# Patient Record
Sex: Female | Born: 1960 | Race: Black or African American | Hispanic: No | Marital: Single | Smoking: Never smoker
Health system: Southern US, Community
[De-identification: ages and names within clinical notes are randomized; demographics above are authoritative.]

## PROBLEM LIST (undated history)

## (undated) DIAGNOSIS — M4800 Spinal stenosis, site unspecified: Secondary | ICD-10-CM

## (undated) DIAGNOSIS — I10 Essential (primary) hypertension: Secondary | ICD-10-CM

## (undated) DIAGNOSIS — K219 Gastro-esophageal reflux disease without esophagitis: Secondary | ICD-10-CM

## (undated) DIAGNOSIS — M1990 Unspecified osteoarthritis, unspecified site: Secondary | ICD-10-CM

## (undated) HISTORY — PX: HERNIA REPAIR: SHX51

## (undated) HISTORY — PX: PITUITARY EXCISION: SHX745

## (undated) HISTORY — PX: TUBAL LIGATION: SHX77

## (undated) HISTORY — PX: CHOLECYSTECTOMY: SHX55

---

## 2016-12-11 HISTORY — PX: BREAST CYST EXCISION: SHX579

## 2018-09-16 ENCOUNTER — Other Ambulatory Visit: Payer: Self-pay

## 2018-09-16 ENCOUNTER — Ambulatory Visit
Admission: EM | Admit: 2018-09-16 | Discharge: 2018-09-16 | Disposition: A | Discharge status: Home / Self Care | Payer: Federal, State, Local not specified - PPO | Attending: Family Medicine | Admitting: Family Medicine

## 2018-09-16 DIAGNOSIS — R1013 Epigastric pain: Secondary | ICD-10-CM

## 2018-09-16 DIAGNOSIS — I878 Other specified disorders of veins: Secondary | ICD-10-CM

## 2018-09-16 HISTORY — DX: Spinal stenosis, site unspecified: M48.00

## 2018-09-16 LAB — COMPREHENSIVE METABOLIC PANEL
ALBUMIN: 4.5 g/dL (ref 3.5–5.0)
ALT: 33 U/L (ref 0–44)
ANION GAP: 11 (ref 5–15)
AST: 23 U/L (ref 15–41)
Alkaline Phosphatase: 65 U/L (ref 38–126)
BUN: 21 mg/dL — ABNORMAL HIGH (ref 6–20)
CO2: 27 mmol/L (ref 22–32)
CREATININE: 0.89 mg/dL (ref 0.44–1.00)
Calcium: 9.3 mg/dL (ref 8.9–10.3)
Chloride: 103 mmol/L (ref 98–111)
GFR calc non Af Amer: >60 mL/min (ref >60)
GLUCOSE: 109 mg/dL — AB (ref 70–99)
Potassium: 3.8 mmol/L (ref 3.5–5.1)
SODIUM: 141 mmol/L (ref 135–145)
TOTAL PROTEIN: 7.8 g/dL (ref 6.5–8.1)
Total Bilirubin: 0.6 mg/dL (ref 0.3–1.2)

## 2018-09-16 LAB — LIPASE, BLOOD: Lipase: 20 U/L (ref 11–51)

## 2018-09-16 NOTE — ED Triage Notes (Signed)
Patient complains of bilateral leg swelling on and off for one month.

## 2018-09-16 NOTE — ED Provider Notes (Signed)
[facility]    CSN: [id number] Arrival date & time: 09/16/18  1438  History   Chief Complaint Chief Complaint  Patient presents with  . Leg Swelling   HPI  57 year old female presents with bilateral lower extremity swelling as well as epigastric pain.  1 month history of swelling in the feet/ankles.  She has been taking NSAIDs more frequently recently.  She is currently on diuretic therapy for hypertension.  She is not particularly active secondary to ongoing chronic back pain.  Improve after elevating/sleeping.  They are normal in the morning and then worsened throughout the day. No other associated symptoms.   Additionally, patient reports a one-week history of abdominal pain.  Located in the epigastric region.  Mild.  She has been using NSAIDs recently.  Has a history of pancreatitis and wants to be tested for this today.  No recent alcohol use.  No other complaints.  PMH, Surgical Hx, Family Hx, Social History reviewed and updated as below.  PMH:  Hx of pancreatitis Past Medical History:  Diagnosis Date  . Spinal stenosis    Past Surgical History:  Procedure Laterality Date  . CHOLECYSTECTOMY    . HERNIA REPAIR    . PITUITARY EXCISION    . TUBAL LIGATION      OB History   None    Home Medications    Prior to Admission medications   Medication Sig Start Date End Date Taking? Authorizing Provider  candesartan (ATACAND) 16 MG tablet Take 16 mg by mouth daily.   Yes [provider]  HYDROcodone-acetaminophen (NORCO/VICODIN) 5-325 MG tablet Take 1 tablet by mouth every 6 (six) hours as needed for moderate pain.   Yes [provider]  metaxalone (SKELAXIN) 800 MG tablet Take 800 mg by mouth 3 (three) times daily.   Yes [provider]  metoprolol succinate (TOPROL-XL) 100 MG 24 hr tablet Take 100 mg by mouth daily. Take with or immediately following a meal.   Yes [provider]  naproxen (NAPROSYN) 500 MG tablet Take 500  mg by mouth 2 (two) times daily with a meal.   Yes [provider]  omeprazole (PRILOSEC) 40 MG capsule Take 40 mg by mouth daily.   Yes [provider]  triamterene-hydrochlorothiazide (MAXZIDE-25) 37.5-25 MG tablet Take 1 tablet by mouth daily.   Yes [provider]   Family History Family History  Problem Relation Age of Onset  . Diabetes Mother   . Hypertension Mother    Social History Social History   Tobacco Use  . Smoking status: Never Smoker  . Smokeless tobacco: Never Used  Substance Use Topics  . Alcohol use: Yes    Comment: occasionally  . Drug use: Not Currently   Allergies   Amlodipine and Trilyte [peg 3350-kcl-na bicarb-nacl]   Review of Systems Review of Systems  Respiratory: Negative.   Cardiovascular: Positive for leg swelling.  Gastrointestinal: Positive for abdominal pain.   Physical Exam Triage Vital Signs ED Triage Vitals  Enc Vitals Group     BP 09/16/18 1549 (!) 167/94     Pulse Rate 09/16/18 1549 70     Resp 09/16/18 1549 18     Temp 09/16/18 1549 98 F (36.7 C)     Temp Source 09/16/18 1549 Oral     SpO2 09/16/18 1549 100 %     Weight 09/16/18 1543 235 lb (106.6 kg)     Height 09/16/18 1543 5\' 4"  (1.626 m)     Head Circumference --  Peak Flow --      Pain Score 09/16/18 1542 7     Pain Loc --      Pain Edu? --      Excl. in GC? --    Updated Vital Signs BP (!) 167/94 (BP Location: Left Arm)   Pulse 70   Temp 98 F (36.7 C) (Oral)   Resp 18   Ht 5\' 4"  (1.626 m)   Wt 106.6 kg   SpO2 100%   BMI 40.34 kg/m   Visual Acuity Right Eye Distance:   Left Eye Distance:   Bilateral Distance:    Right Eye Near:   Left Eye Near:    Bilateral Near:     Physical Exam  Constitutional: She is oriented to person, place, and time. She appears well-developed. No distress.  HENT:  Head: Normocephalic and atraumatic.  Cardiovascular: Normal rate and regular rhythm.  Pulmonary/Chest: Effort normal and breath  sounds normal. She has no wheezes. She has no rales.  Abdominal: Soft. She exhibits no distension. There is no tenderness.  Neurological: She is alert and oriented to person, place, and time.  Psychiatric: She has a normal mood and affect. Her behavior is normal.  Nursing note and vitals reviewed.  UC Treatments / Results  Labs (all labs ordered are listed, but only abnormal results are displayed) Labs Reviewed  COMPREHENSIVE METABOLIC PANEL - Abnormal; Notable for the following components:      Result Value   Glucose, Bld 109 (*)    BUN 21 (*)    All other components within normal limits  LIPASE, BLOOD    EKG None  Radiology No results found.  Procedures Procedures (including critical care time)  Medications Ordered in UC Medications - No data to display  Initial Impression / Assessment and Plan / UC Course  I have reviewed the triage vital signs and the nursing notes.  Pertinent labs & imaging results that were available during my care of the patient were reviewed by me and considered in my medical decision making (see chart for details).    57 year old female presents with lower extremity edema.  This appears to be secondary to venous stasis and obesity.  Advised activity and compression stockings.  Regarding her abdominal pain, her exam is benign.  Laboratory studies done and were unrevealing.  Avoid NSAIDs.  Supportive care.  Final Clinical Impressions(s) / UC Diagnoses   Final diagnoses:  Venous stasis  Abdominal pain, epigastric     Discharge Instructions     Compression stockings. OTC Zantac. Try and avoid NSAID's.  Take care  Dr. Adriana Simas     ED Prescriptions    None     Controlled Substance Prescriptions [place] Controlled Substance Registry consulted? Not Applicable   Tommie Sams, DO 09/16/18 1701

## 2018-09-16 NOTE — Discharge Instructions (Signed)
Compression stockings. OTC Zantac. Try and avoid NSAID's.  Take care  Dr. Adriana Simas

## 2018-10-25 ENCOUNTER — Encounter: Payer: Self-pay | Admitting: Emergency Medicine

## 2018-10-25 ENCOUNTER — Other Ambulatory Visit: Payer: Self-pay

## 2018-10-25 ENCOUNTER — Ambulatory Visit
Admission: EM | Admit: 2018-10-25 | Discharge: 2018-10-25 | Disposition: A | Discharge status: Home / Self Care | Payer: Federal, State, Local not specified - PPO | Attending: Family Medicine | Admitting: Family Medicine

## 2018-10-25 ENCOUNTER — Ambulatory Visit (INDEPENDENT_AMBULATORY_CARE_PROVIDER_SITE_OTHER): Payer: Federal, State, Local not specified - PPO

## 2018-10-25 DIAGNOSIS — M19071 Primary osteoarthritis, right ankle and foot: Secondary | ICD-10-CM | POA: Diagnosis not present

## 2018-10-25 DIAGNOSIS — S99921A Unspecified injury of right foot, initial encounter: Secondary | ICD-10-CM | POA: Diagnosis not present

## 2018-10-25 DIAGNOSIS — M79671 Pain in right foot: Secondary | ICD-10-CM | POA: Diagnosis not present

## 2018-10-25 DIAGNOSIS — W208XXA Other cause of strike by thrown, projected or falling object, initial encounter: Secondary | ICD-10-CM

## 2018-10-25 MED ORDER — MELOXICAM 15 MG PO TABS: 15.0000 mg | ORAL_TABLET | Freq: Every day | ORAL | 0 refills | Status: DC | PRN

## 2018-10-25 NOTE — ED Provider Notes (Signed)
[facility]    CSN: [id number] Arrival date & time: 10/25/18  1240  History   Chief Complaint Chief Complaint  Patient presents with  . Foot Pain    right   HPI   57 year old female presents with a right foot pain.  Started on Saturday after she dropped an object on her foot.  She states that she was packing some boxes.  She has a known bunion to this foot.  Her pain is located at the bunion.  Mild swelling.  Mild warmth.  Worse with activity.  No relieving factors.  No other reported symptoms.  No other complaints.   PMH, Surgical Hx, Family Hx, Social History reviewed and updated as below.  Past Medical History:  Diagnosis Date  . Spinal stenosis    Past Surgical History:  Procedure Laterality Date  . CHOLECYSTECTOMY    . HERNIA REPAIR    . PITUITARY EXCISION    . TUBAL LIGATION      OB History   None    Home Medications    Prior to Admission medications   Medication Sig Start Date End Date Taking? Authorizing Provider  candesartan (ATACAND) 16 MG tablet Take 16 mg by mouth daily.   Yes [provider]  HYDROcodone-acetaminophen (NORCO/VICODIN) 5-325 MG tablet Take 1 tablet by mouth every 6 (six) hours as needed for moderate pain.   Yes [provider]  metaxalone (SKELAXIN) 800 MG tablet Take 800 mg by mouth 3 (three) times daily.   Yes [provider]  metoprolol succinate (TOPROL-XL) 100 MG 24 hr tablet Take 100 mg by mouth daily. Take with or immediately following a meal.   Yes [provider]  omeprazole (PRILOSEC) 40 MG capsule Take 40 mg by mouth daily.   Yes [provider]  triamterene-hydrochlorothiazide (MAXZIDE-25) 37.5-25 MG tablet Take 1 tablet by mouth daily.   Yes [provider]  meloxicam (MOBIC) 15 MG tablet Take 1 tablet (15 mg total) by mouth daily as needed. 10/25/18   Tommie Samsook, Jakiah Goree G, DO   Family History Family History  Problem Relation Age of Onset  . Diabetes Mother   .  Hypertension Mother     Social History Social History   Tobacco Use  . Smoking status: Never Smoker  . Smokeless tobacco: Never Used  Substance Use Topics  . Alcohol use: Yes    Comment: occasionally  . Drug use: Not Currently     Allergies   Amlodipine and Trilyte [peg 3350-kcl-na bicarb-nacl]   Review of Systems Review of Systems  Constitutional: Negative.   Musculoskeletal:       Right foot pain.   Physical Exam Triage Vital Signs ED Triage Vitals  Enc Vitals Group     BP 10/25/18 1255 (!) 179/103     Pulse Rate 10/25/18 1255 83     Resp 10/25/18 1255 16     Temp 10/25/18 1255 98.2 F (36.8 C)     Temp Source 10/25/18 1255 Oral     SpO2 10/25/18 1255 99 %     Weight 10/25/18 1253 240 lb (108.9 kg)     Height 10/25/18 1253 5\' 4"  (1.626 m)     Head Circumference --      Peak Flow --      Pain Score 10/25/18 1253 7     Pain Loc --      Pain Edu? --      Excl. in GC? --    Updated Vital Signs BP Marland Kitchen(!)  179/103 (BP Location: Right Arm)   Pulse 83   Temp 98.2 F (36.8 C) (Oral)   Resp 16   Ht 5\' 4"  (1.626 m)   Wt 108.9 kg   SpO2 99%   BMI 41.20 kg/m   Visual Acuity Right Eye Distance:   Left Eye Distance:   Bilateral Distance:    Right Eye Near:   Left Eye Near:    Bilateral Near:     Physical Exam  Constitutional: She is oriented to person, place, and time. She appears well-developed. No distress.  HENT:  Head: Normocephalic and atraumatic.  Cardiovascular: Normal rate and regular rhythm.  Pulmonary/Chest: Effort normal. No respiratory distress.  Musculoskeletal:  Right foot -hallux valgus noted.  Mild tenderness to palpation.  Neurological: She is alert and oriented to person, place, and time.  Psychiatric: She has a normal mood and affect. Her behavior is normal.  Nursing note and vitals reviewed.  UC Treatments / Results  Labs (all labs ordered are listed, but only abnormal results are displayed) Labs Reviewed - No data to  display  EKG None  Radiology Dg Foot Complete Right  Result Date: 10/25/2018 CLINICAL DATA:  Trauma, injury, swelling EXAM: RIGHT FOOT COMPLETE - 3+ VIEW COMPARISON:  None. FINDINGS: Hallux valgus deformity noted of the right first MTP joint with associated osteoarthritis. No acute osseous finding or fracture. No other significant joint abnormality. Small plantar calcaneal spur. No radiopaque foreign body. 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 No other acute finding by plain radiography Electronically Signed   By: Judie Petit.  Shick M.D.   On: 10/25/2018 13:30    Procedures Procedures (including critical care time)  Medications Ordered in UC Medications - No data to display  Initial Impression / Assessment and Plan / UC Course  I have reviewed the triage vital signs and the nursing notes.  Pertinent labs & imaging results that were available during my care of the patient were reviewed by me and considered in my medical decision making (see chart for details).    57 year old female presents with right foot pain.  Patient has hallux valgus and associated osteoarthritis.  Letter from the injury.  Treating with meloxicam, rest, ice, elevation.  Supportive care.  Final Clinical Impressions(s) / UC Diagnoses   Final diagnoses:  Right foot pain     Discharge Instructions     Rest, ice, elevate.  Mobic as prescribed.  Take care  Dr. Adriana Simas    ED Prescriptions    Medication Sig Dispense Auth. Provider   meloxicam (MOBIC) 15 MG tablet Take 1 tablet (15 mg total) by mouth daily as needed. 30 tablet Tommie Sams, DO     Controlled Substance Prescriptions [place] Controlled Substance Registry consulted? Not Applicable   Tommie Sams, DO 10/25/18 1354

## 2018-10-25 NOTE — Discharge Instructions (Signed)
Rest, ice, elevate.  Mobic as prescribed.  Take care  Dr. Adriana Simasook

## 2018-10-25 NOTE — ED Triage Notes (Signed)
Patient c/o right foot pain and swelling that started on Saturday.  Patient states that something fell on her right foot on Saturday.

## 2018-12-05 ENCOUNTER — Other Ambulatory Visit: Payer: Self-pay | Admitting: Family Medicine

## 2019-01-07 ENCOUNTER — Other Ambulatory Visit: Payer: Self-pay | Admitting: Podiatry

## 2019-01-08 ENCOUNTER — Other Ambulatory Visit: Payer: Self-pay

## 2019-01-08 ENCOUNTER — Encounter: Payer: Self-pay | Admitting: *Deleted

## 2019-01-08 ENCOUNTER — Encounter: Payer: Self-pay | Admitting: Anesthesiology

## 2019-01-09 NOTE — Discharge Instructions (Signed)
[facility] °[facility] ° °POST OPERATIVE INSTRUCTIONS FOR DR. TROXLER AND DR. FOWLER °[facility] ° ° °1. Take your medication as prescribed.  Pain medication should be taken only as needed. ° °2. Keep the dressing clean, dry and intact. ° °3. Keep your foot elevated above the heart level for the first 48 hours. ° °4. Walking to the bathroom and brief periods of walking are acceptable, unless we have instructed you to be non-weight bearing. ° °5. Always wear your post-op shoe when walking.  Always use your crutches if you are to be non-weight bearing. ° °6. Do not take a shower. Baths are permissible as long as the foot is kept out of the water.  ° °7. Every hour you are awake:  °- Bend your knee 15 times. °- Flex foot 15 times °- Massage calf 15 times ° °8. Call [facility] ([phone]) if any of the following problems occur: °- You develop a temperature or fever. °- The bandage becomes saturated with blood. °- Medication does not stop your pain. °- Injury of the foot occurs. °- Any symptoms of infection including redness, odor, or red streaks running from wound. ° ° °General Anesthesia, Adult, Care After °This sheet gives you information about how to care for yourself after your procedure. Your health care provider may also give you more specific instructions. If you have problems or questions, contact your health care provider. °What can I expect after the procedure? °After the procedure, the following side effects are common: °· Pain or discomfort at the IV site. °· Nausea. °· Vomiting. °· Sore throat. °· Trouble concentrating. °· Feeling cold or chills. °· Weak or tired. °· Sleepiness and fatigue. °· Soreness and body aches. These side effects can affect parts of the body that were not involved in surgery. °Follow these instructions at home: ° °For at least 24 hours after the procedure: °· Have a responsible adult stay with you. It is important to  have someone help care for you until you are awake and alert. °· Rest as needed. °· Do not: °? Participate in activities in which you could fall or become injured. °? Drive. °? Use heavy machinery. °? Drink alcohol. °? Take sleeping pills or medicines that cause drowsiness. °? Make important decisions or sign legal documents. °? Take care of children on your own. °Eating and drinking °· Follow any instructions from your health care provider about eating or drinking restrictions. °· When you feel hungry, start by eating small amounts of foods that are soft and easy to digest (bland), such as toast. Gradually return to your regular diet. °· Drink enough fluid to keep your urine pale yellow. °· If you vomit, rehydrate by drinking water, juice, or clear broth. °General instructions °· If you have sleep apnea, surgery and certain medicines can increase your risk for breathing problems. Follow instructions from your health care provider about wearing your sleep device: °? Anytime you are sleeping, including during daytime naps. °? While taking prescription pain medicines, sleeping medicines, or medicines that make you drowsy. °· Return to your normal activities as told by your health care provider. Ask your health care provider what activities are safe for you. °· Take over-the-counter and prescription medicines only as told by your health care provider. °· If you smoke, do not smoke without supervision. °· Keep all follow-up visits as told by your health care provider. This is important. °Contact a health care provider if: °· You have   nausea or vomiting that does not get better with medicine. °· You cannot eat or drink without vomiting. °· You have pain that does not get better with medicine. °· You are unable to pass urine. °· You develop a skin rash. °· You have a fever. °· You have redness around your IV site that gets worse. °Get help right away if: °· You have difficulty breathing. °· You have chest pain. °· You  have blood in your urine or stool, or you vomit blood. °Summary °· After the procedure, it is common to have a sore throat or nausea. It is also common to feel tired. °· Have a responsible adult stay with you for the first 24 hours after general anesthesia. It is important to have someone help care for you until you are awake and alert. °· When you feel hungry, start by eating small amounts of foods that are soft and easy to digest (bland), such as toast. Gradually return to your regular diet. °· Drink enough fluid to keep your urine pale yellow. °· Return to your normal activities as told by your health care provider. Ask your health care provider what activities are safe for you. °This information is not intended to replace advice given to you by your health care provider. Make sure you discuss any questions you have with your health care provider. °Document Released: 03/05/2001 Document Revised: 07/13/2017 Document Reviewed: 07/13/2017 °Elsevier Interactive Patient Education © 2019 [employer] ° °

## 2019-01-16 ENCOUNTER — Encounter
Admission: RE | Disposition: A | Discharge status: Home / Self Care | Payer: Self-pay | Source: Home / Self Care | Attending: Podiatry

## 2019-01-16 ENCOUNTER — Ambulatory Visit
Admission: RE | Admit: 2019-01-16 | Discharge: 2019-01-16 | Disposition: A | Discharge status: Home / Self Care | Payer: Federal, State, Local not specified - PPO | Attending: Podiatry | Admitting: Podiatry

## 2019-01-16 DIAGNOSIS — Z539 Procedure and treatment not carried out, unspecified reason: Secondary | ICD-10-CM | POA: Diagnosis not present

## 2019-01-16 HISTORY — DX: Unspecified osteoarthritis, unspecified site: M19.90

## 2019-01-16 HISTORY — DX: Gastro-esophageal reflux disease without esophagitis: K21.9

## 2019-01-16 HISTORY — DX: Essential (primary) hypertension: I10

## 2019-01-16 LAB — POCT PREGNANCY, URINE: Preg Test, Ur: NEGATIVE

## 2019-01-16 SURGERY — BUNIONECTOMY, LAPIDUS
Anesthesia: Choice | Laterality: Right

## 2019-01-16 MED ORDER — LACTATED RINGERS IV SOLN: INTRAVENOUS | Status: DC

## 2019-01-16 SURGICAL SUPPLY — 46 items
BANDAGE ELASTIC 4 VELCRO NS (GAUZE/BANDAGES/DRESSINGS) ×2 IMPLANT
BENZOIN TINCTURE PRP APPL 2/3 (GAUZE/BANDAGES/DRESSINGS) ×2 IMPLANT
BLADE OSC/SAGITTAL MD 5.5X18 (BLADE) IMPLANT
BLADE OSC/SAGITTAL MD 9X18.5 (BLADE) IMPLANT
BNDG ESMARK 4X12 TAN STRL LF (GAUZE/BANDAGES/DRESSINGS) ×2 IMPLANT
BNDG GAUZE 4.5X4.1 6PLY STRL (MISCELLANEOUS) ×2 IMPLANT
BNDG STRETCH 4X75 STRL LF (GAUZE/BANDAGES/DRESSINGS) ×2 IMPLANT
CANISTER SUCT 1200ML W/VALVE (MISCELLANEOUS) ×2 IMPLANT
CAST PADDING 3X4FT ST 30246 (SOFTGOODS) ×2
COVER LIGHT HANDLE UNIVERSAL (MISCELLANEOUS) ×4 IMPLANT
CUFF TOURN SGL QUICK 18 (TOURNIQUET CUFF) ×2 IMPLANT
DRAPE FLUOR MINI C-ARM 54X84 (DRAPES) ×2 IMPLANT
DURAPREP 26ML APPLICATOR (WOUND CARE) ×2 IMPLANT
ELECT REM PT RETURN 9FT ADLT (ELECTROSURGICAL) ×2
ELECTRODE REM PT RTRN 9FT ADLT (ELECTROSURGICAL) ×1 IMPLANT
GAUZE PETRO XEROFOAM 1X8 (MISCELLANEOUS) ×2 IMPLANT
GAUZE SPONGE 4X4 12PLY STRL (GAUZE/BANDAGES/DRESSINGS) ×2 IMPLANT
GLOVE BIO SURGEON STRL SZ8 (GLOVE) ×2 IMPLANT
GOWN STRL REUS W/ TWL LRG LVL3 (GOWN DISPOSABLE) ×1 IMPLANT
GOWN STRL REUS W/ TWL XL LVL3 (GOWN DISPOSABLE) ×1 IMPLANT
GOWN STRL REUS W/TWL LRG LVL3 (GOWN DISPOSABLE) ×1
GOWN STRL REUS W/TWL XL LVL3 (GOWN DISPOSABLE) ×1
K-WIRE DBL END TROCAR 6X.045 (WIRE)
K-WIRE DBL END TROCAR 6X.062 (WIRE)
KIT TURNOVER KIT A (KITS) ×2 IMPLANT
KWIRE DBL END TROCAR 6X.045 (WIRE) IMPLANT
KWIRE DBL END TROCAR 6X.062 (WIRE) IMPLANT
NEEDLE 18GX1X1/2 (RX/OR ONLY) (NEEDLE) ×2 IMPLANT
NEEDLE HYPO 25GX1X1/2 BEV (NEEDLE) ×2 IMPLANT
NS IRRIG 500ML POUR BTL (IV SOLUTION) ×2 IMPLANT
PACK EXTREMITY ARMC (MISCELLANEOUS) ×2 IMPLANT
PAD CAST CTTN 3X4 STRL (SOFTGOODS) ×2 IMPLANT
PENCIL SMOKE EVACUATOR (MISCELLANEOUS) ×2 IMPLANT
RASP SM TEAR CROSS CUT (RASP) IMPLANT
SPLINT CAST 1 STEP 4X30 (MISCELLANEOUS) ×2 IMPLANT
SPLINT FAST PLASTER 5X30 (CAST SUPPLIES) ×1
SPLINT PLASTER CAST FAST 5X30 (CAST SUPPLIES) ×1 IMPLANT
STOCKINETTE STRL 6IN 960660 (GAUZE/BANDAGES/DRESSINGS) ×2 IMPLANT
STRIP CLOSURE SKIN 1/4X4 (GAUZE/BANDAGES/DRESSINGS) ×2 IMPLANT
SUT VIC AB 2-0 SH 27 (SUTURE)
SUT VIC AB 2-0 SH 27XBRD (SUTURE) IMPLANT
SUT VIC AB 3-0 SH 27 (SUTURE)
SUT VIC AB 3-0 SH 27X BRD (SUTURE) IMPLANT
SUT VIC AB 4-0 FS2 27 (SUTURE) ×2 IMPLANT
SUT VICRYL AB 3-0 FS1 BRD 27IN (SUTURE) IMPLANT
SYR 10ML LL (SYRINGE) ×2 IMPLANT

## 2019-01-16 NOTE — Anesthesia Preprocedure Evaluation (Deleted)

## 2019-01-17 ENCOUNTER — Ambulatory Visit
Admission: EM | Admit: 2019-01-17 | Discharge: 2019-01-17 | Disposition: A | Discharge status: Home / Self Care | Payer: Federal, State, Local not specified - PPO | Attending: Family Medicine | Admitting: Family Medicine

## 2019-01-17 ENCOUNTER — Other Ambulatory Visit: Payer: Self-pay

## 2019-01-17 DIAGNOSIS — I10 Essential (primary) hypertension: Secondary | ICD-10-CM | POA: Diagnosis not present

## 2019-01-17 NOTE — ED Provider Notes (Signed)
[facility]    CSN: [id number] Arrival date & time: 01/17/19  1026     History   Chief Complaint Chief Complaint  Patient presents with  . Hypertension    HPI Dana Hale is a 58 y.o. female.   58 yo female with a c/o high blood pressure. States she takes blood pressure medicine prescribed by her prior PCP but she has an appointment with a new pcp next Tuesday. Denies any chest pains, shortness of breath, numbness/tingling, vision changes.  The history is provided by the patient.    Past Medical History:  Diagnosis Date  . Arthritis    lower back, knees  . GERD (gastroesophageal reflux disease)   . Hypertension   . Spinal stenosis     There are no active problems to display for this patient.   Past Surgical History:  Procedure Laterality Date  . CHOLECYSTECTOMY    . HERNIA REPAIR    . PITUITARY EXCISION    . TUBAL LIGATION      OB History   No obstetric history on file.      Home Medications    Prior to Admission medications   Medication Sig Start Date End Date Taking? Authorizing Provider  BIOTIN PO Take by mouth daily.   Yes [provider]  candesartan (ATACAND) 16 MG tablet Take 16 mg by mouth daily.   Yes [provider]  Cyanocobalamin (VITAMIN B-12 PO) Take by mouth daily.   Yes [provider]  HYDROcodone-acetaminophen (NORCO/VICODIN) 5-325 MG tablet Take 1 tablet by mouth every 6 (six) hours as needed for moderate pain.   Yes [provider]  metaxalone (SKELAXIN) 800 MG tablet Take 800 mg by mouth 3 (three) times daily.   Yes [provider]  metoprolol succinate (TOPROL-XL) 100 MG 24 hr tablet Take 100 mg by mouth daily. Take with or immediately following a meal.   Yes [provider]  Multiple Vitamins-Minerals (MULTIVITAMIN WOMEN PO) Take by mouth daily.   Yes [provider]  naproxen (NAPROSYN) 500 MG tablet Take 500 mg by mouth 2 (two) times daily with a meal.    Yes [provider]  omeprazole (PRILOSEC) 40 MG capsule Take 40 mg by mouth daily.   Yes [provider]  triamterene-hydrochlorothiazide (MAXZIDE-25) 37.5-25 MG tablet Take 1 tablet by mouth daily.   Yes [provider]  meloxicam (MOBIC) 15 MG tablet Take 1 tablet (15 mg total) by mouth daily as needed. 10/25/18   Tommie Sams, DO    Family History Family History  Problem Relation Age of Onset  . Diabetes Mother   . Hypertension Mother     Social History Social History   Tobacco Use  . Smoking status: Never Smoker  . Smokeless tobacco: Never Used  Substance Use Topics  . Alcohol use: Yes    Alcohol/week: 1.0 standard drinks    Types: 1 Standard drinks or equivalent per week    Comment: occasionally  . Drug use: Not Currently     Allergies   Amlodipine and Trilyte [peg 3350-kcl-na bicarb-nacl]   Review of Systems Review of Systems   Physical Exam Triage Vital Signs ED Triage Vitals  Enc Vitals Group     BP 01/17/19 1043 (!) 160/112     Pulse Rate 01/17/19 1043 81     Resp 01/17/19 1043 16     Temp 01/17/19 1043 98.2 F (36.8 C)     Temp Source 01/17/19 1043 Oral  SpO2 01/17/19 1043 97 %     Weight 01/17/19 1041 247 lb (112 kg)     Height 01/17/19 1041 5\' 4"  (1.626 m)     Head Circumference --      Peak Flow --      Pain Score 01/17/19 1041 0     Pain Loc --      Pain Edu? --      Excl. in GC? --    No data found.  Updated Vital Signs BP (!) 160/112 (BP Location: Right Arm)   Pulse 81   Temp 98.2 F (36.8 C) (Oral)   Resp 16   Ht 5\' 4"  (1.626 m)   Wt 112 kg   SpO2 97%   BMI 42.40 kg/m   Visual Acuity Right Eye Distance:   Left Eye Distance:   Bilateral Distance:    Right Eye Near:   Left Eye Near:    Bilateral Near:     Physical Exam Vitals signs and nursing note reviewed.  Constitutional:      General: She is not in acute distress.    Appearance: She is not toxic-appearing or diaphoretic.    Cardiovascular:     Rate and Rhythm: Normal rate and regular rhythm.     Heart sounds: Normal heart sounds.  Pulmonary:     Effort: Pulmonary effort is normal. No respiratory distress.     Breath sounds: Normal breath sounds. No stridor. No wheezing, rhonchi or rales.  Neurological:     Mental Status: She is alert.      UC Treatments / Results  Labs (all labs ordered are listed, but only abnormal results are displayed) Labs Reviewed - No data to display  EKG None  Radiology No results found.  Procedures Procedures (including critical care time)  Medications Ordered in UC Medications - No data to display  Initial Impression / Assessment and Plan / UC Course  I have reviewed the triage vital signs and the nursing notes.  Pertinent labs & imaging results that were available during my care of the patient were reviewed by me and considered in my medical decision making (see chart for details).      Final Clinical Impressions(s) / UC Diagnoses   Final diagnoses:  Hypertension, unspecified type     Discharge Instructions     Increase candesartan to 2 tablets per day (total 32mg  daily)    ED Prescriptions    None      1. diagnosis reviewed with patient 2. Recommend increasing candesartan as above 3. Continue other current medications and follow up with new PCP next week 4. Follow-up here prn   Controlled Substance Prescriptions [place] Controlled Substance Registry consulted? Not Applicable   Payton Mccallum, MD 01/17/19 1240

## 2019-01-17 NOTE — ED Triage Notes (Signed)
Patient states that she was due to have foot surgery yesterday but blood pressure was too elevated. Patient states that she is currently on Metoprolol and Maxzide and Candesartan. States that she is set up to see a new primary doctor here on Tuesday. Was advised to see someone today if blood pressure still elevated.

## 2019-01-17 NOTE — Discharge Instructions (Signed)
Increase candesartan to 2 tablets per day (total 32mg  daily)

## 2019-01-23 ENCOUNTER — Ambulatory Visit: Admit: 2019-01-23 | Payer: Federal, State, Local not specified - PPO | Admitting: Podiatry

## 2019-01-23 SURGERY — CORRECTION, HALLUX VALGUS, WITH AKIN OSTEOTOMY
Anesthesia: Choice | Laterality: Right

## 2019-03-20 ENCOUNTER — Inpatient Hospital Stay: Admission: RE | Admit: 2019-03-20 | Payer: Federal, State, Local not specified - PPO | Source: Ambulatory Visit

## 2019-03-28 ENCOUNTER — Encounter: Admission: RE | Payer: Self-pay | Source: Home / Self Care

## 2019-03-28 ENCOUNTER — Ambulatory Visit
Admission: RE | Admit: 2019-03-28 | Payer: Federal, State, Local not specified - PPO | Source: Home / Self Care | Admitting: Obstetrics and Gynecology

## 2019-03-28 SURGERY — DILATATION AND CURETTAGE /HYSTEROSCOPY
Anesthesia: Choice

## 2019-03-28 SURGERY — BUNIONECTOMY
Anesthesia: Choice | Laterality: Right

## 2019-10-11 ENCOUNTER — Ambulatory Visit: Admit: 2019-10-11 | Payer: Federal, State, Local not specified - PPO | Admitting: Obstetrics and Gynecology

## 2019-10-11 SURGERY — DILATATION AND CURETTAGE /HYSTEROSCOPY
Anesthesia: Choice

## 2019-10-13 ENCOUNTER — Other Ambulatory Visit: Payer: Self-pay | Admitting: Gerontology

## 2019-10-13 DIAGNOSIS — Z1231 Encounter for screening mammogram for malignant neoplasm of breast: Secondary | ICD-10-CM

## 2020-01-08 ENCOUNTER — Other Ambulatory Visit: Payer: Self-pay

## 2020-01-08 ENCOUNTER — Encounter (INDEPENDENT_AMBULATORY_CARE_PROVIDER_SITE_OTHER): Payer: Self-pay

## 2020-01-08 ENCOUNTER — Ambulatory Visit
Admission: RE | Admit: 2020-01-08 | Discharge: 2020-01-08 | Disposition: A | Discharge status: Home / Self Care | Payer: Federal, State, Local not specified - PPO | Source: Ambulatory Visit | Attending: Gerontology | Admitting: Gerontology

## 2020-01-08 DIAGNOSIS — Z1231 Encounter for screening mammogram for malignant neoplasm of breast: Secondary | ICD-10-CM | POA: Diagnosis not present

## 2020-01-16 ENCOUNTER — Other Ambulatory Visit: Payer: Self-pay | Admitting: Nephrology

## 2020-01-16 DIAGNOSIS — N179 Acute kidney failure, unspecified: Secondary | ICD-10-CM

## 2020-01-26 ENCOUNTER — Ambulatory Visit
Admission: RE | Admit: 2020-01-26 | Discharge: 2020-01-26 | Disposition: A | Discharge status: Home / Self Care | Payer: Federal, State, Local not specified - PPO | Source: Ambulatory Visit | Attending: Nephrology | Admitting: Nephrology

## 2020-01-26 ENCOUNTER — Other Ambulatory Visit: Payer: Self-pay

## 2020-01-26 DIAGNOSIS — N179 Acute kidney failure, unspecified: Secondary | ICD-10-CM | POA: Diagnosis not present

## 2020-05-28 IMAGING — US US RENAL
1 series · 14 of 25 positions shown · non-contrast
Comparison: None.

CLINICAL DATA: Acute kidney injury

EXAM:
RENAL / URINARY TRACT ULTRASOUND COMPLETE

[Series 1: us renal · 0.23mm/px · 14 of 31 slices shown]
[im 1/31]
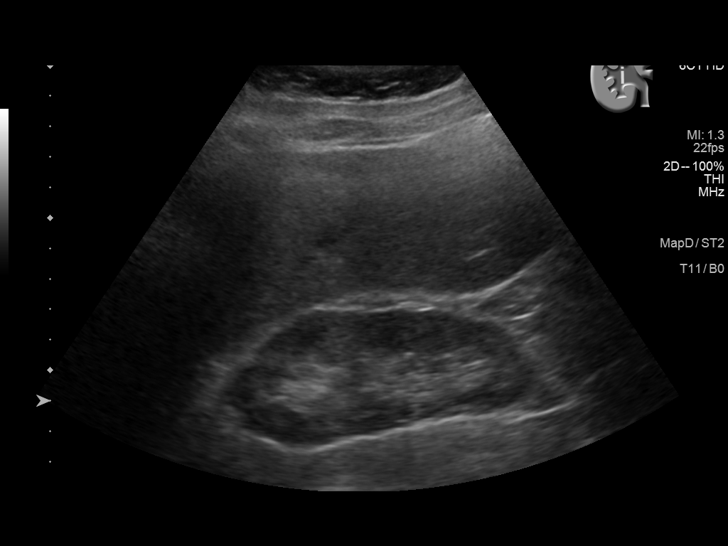
[im 3/31]
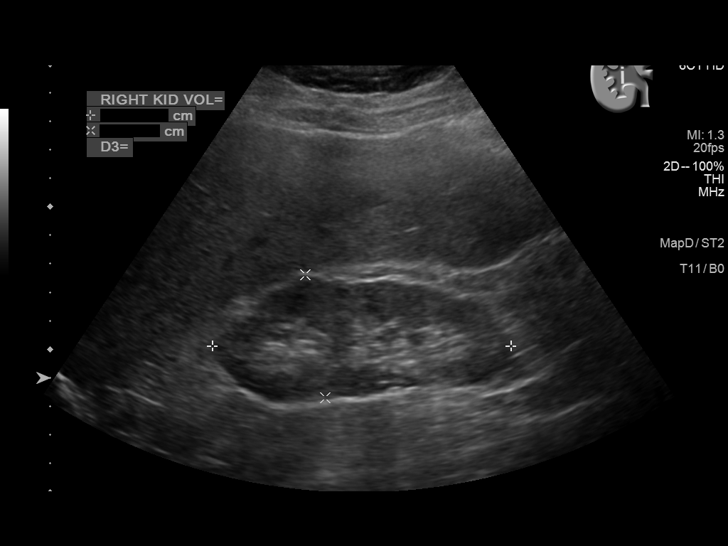
[im 6/31]
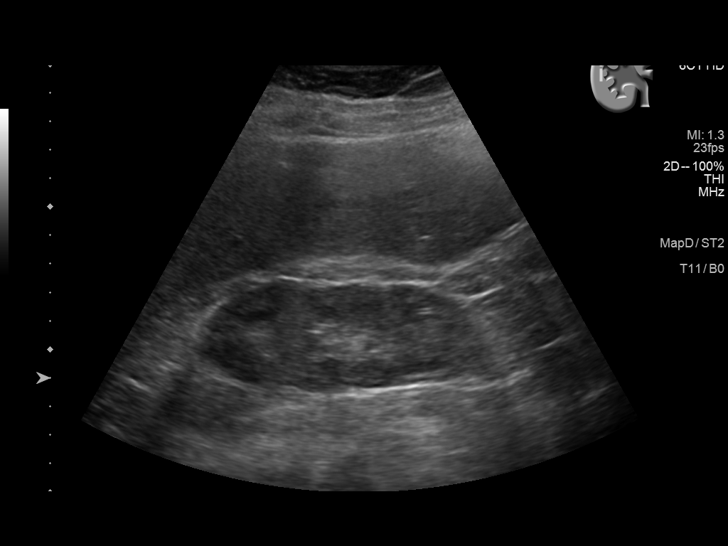
[im 8/31]
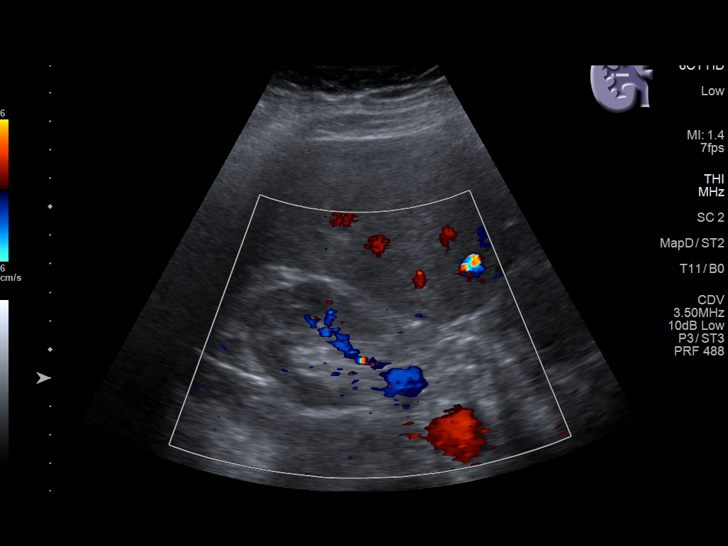
[im 11/31]
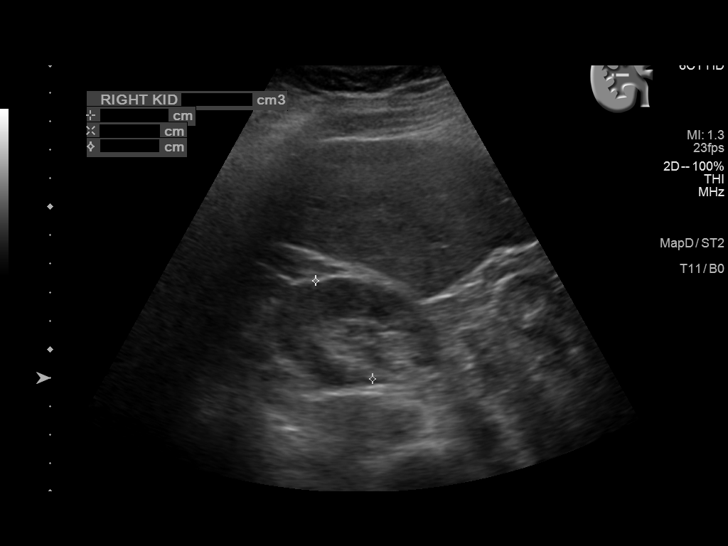
[im 12/31]
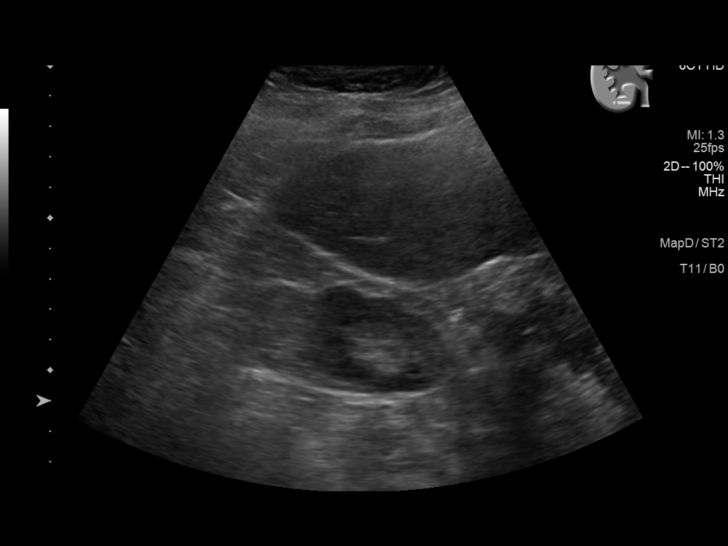
[im 14/31]
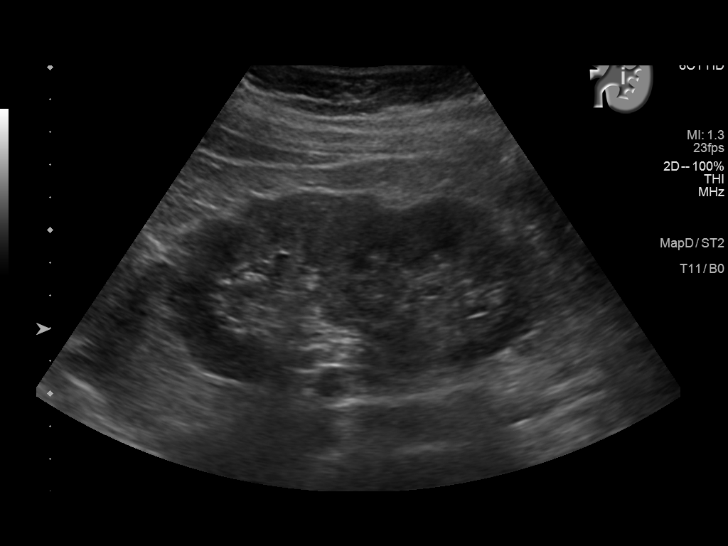
[im 17/31]
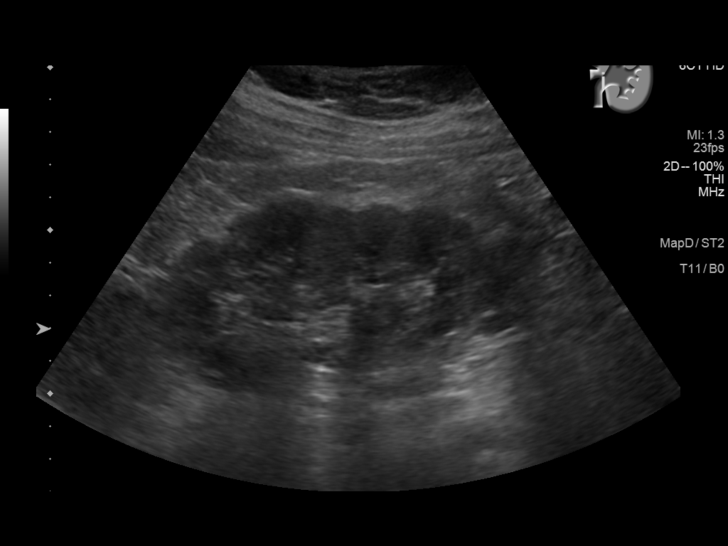
[im 19/31]
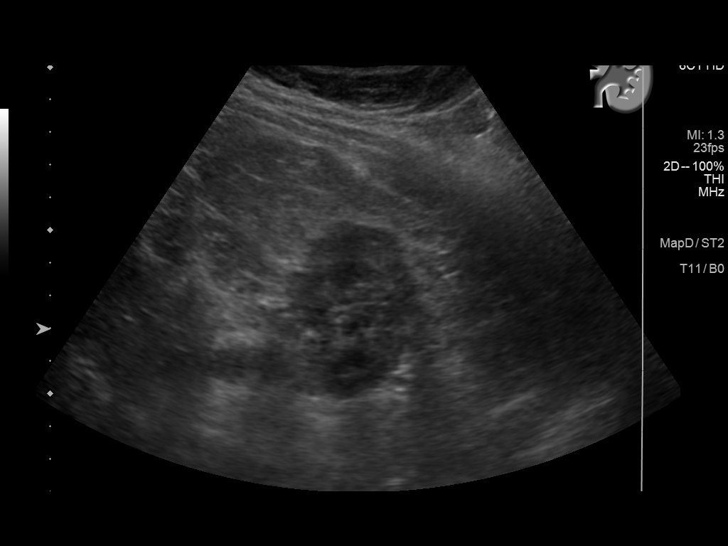
[im 21/31]
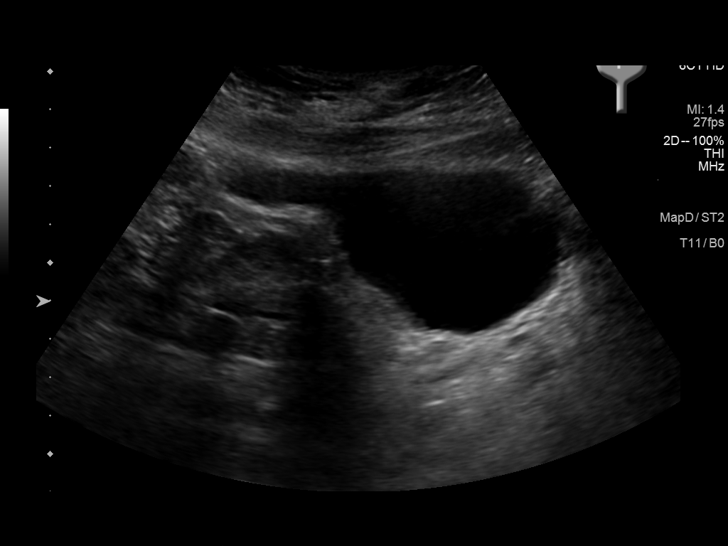
[im 23/31]
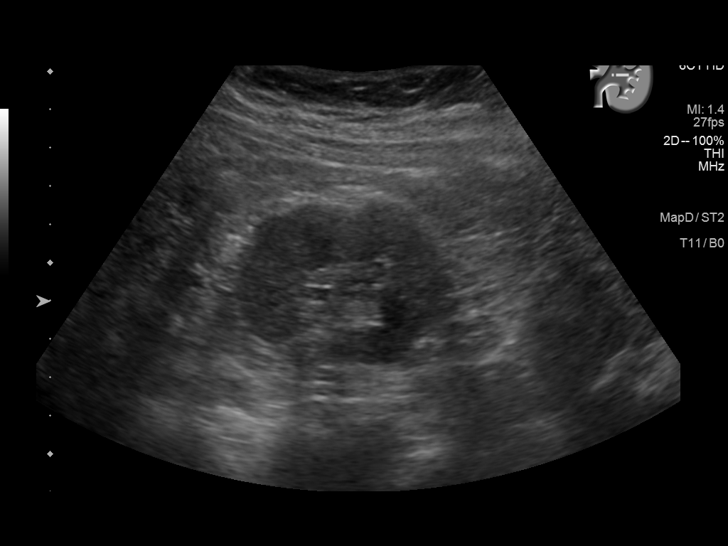
[im 26/31]
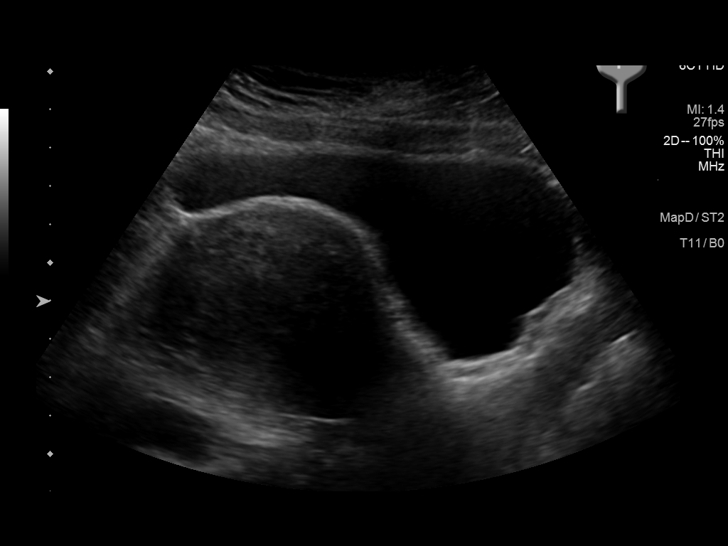
[im 28/31]
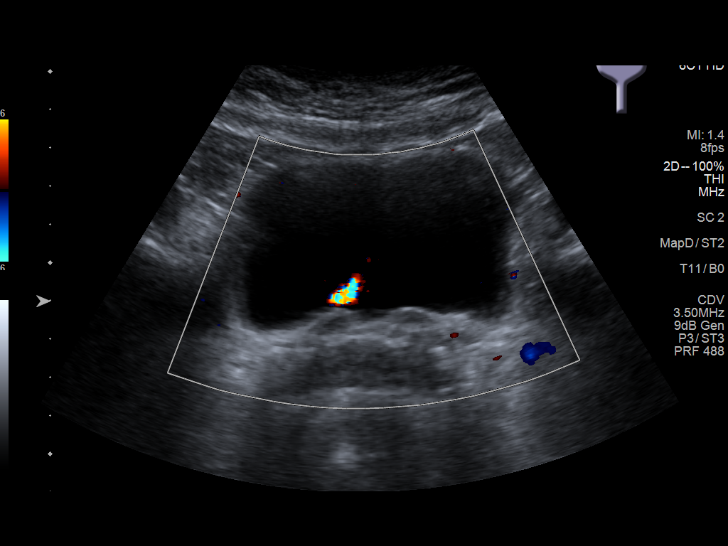
[im 31/31]
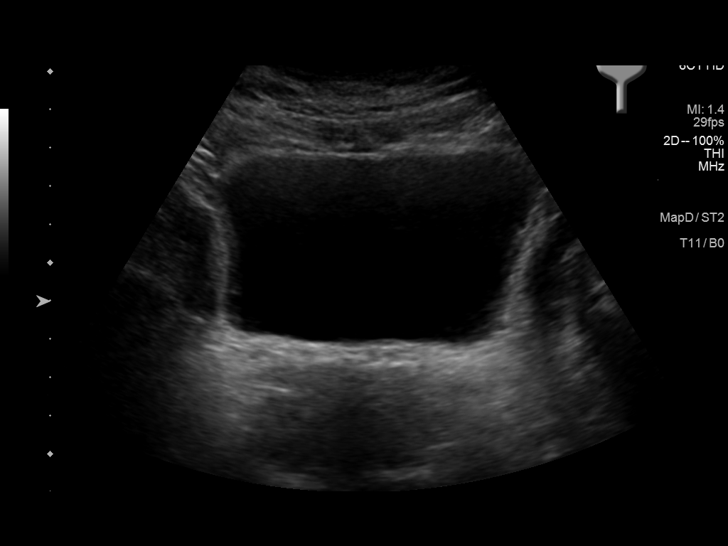

[14 of 25 positions shown; findings below may reference images not displayed]

FINDINGS: Right Kidney:

Renal measurements: 10.5 x 4.4 x 4 cm = volume: 95 mL . Echogenicity
within normal limits. No mass or hydronephrosis visualized.

Left Kidney:

Renal measurements: 11.1 x 5.6 x 4.5 cm = volume: 146 mL.
Echogenicity within normal limits. No mass or hydronephrosis
visualized.

Bladder:

Appears normal for degree of bladder distention.

Other:

None.
IMPRESSION: Normal study.  No significant sonographic abnormality detected.

## 2020-06-29 ENCOUNTER — Other Ambulatory Visit: Payer: Self-pay

## 2020-06-29 ENCOUNTER — Emergency Department
Admission: EM | Admit: 2020-06-29 | Discharge: 2020-06-29 | Disposition: A | Discharge status: Home / Self Care | Payer: Federal, State, Local not specified - PPO | Attending: Emergency Medicine | Admitting: Emergency Medicine

## 2020-06-29 DIAGNOSIS — I10 Essential (primary) hypertension: Secondary | ICD-10-CM | POA: Insufficient documentation

## 2020-06-29 MED ORDER — CANDESARTAN CILEXETIL 32 MG PO TABS: 32.0000 mg | ORAL_TABLET | Freq: Every day | ORAL | 11 refills | Status: AC

## 2020-06-29 NOTE — ED Triage Notes (Signed)
Pt arrives via POV after being sent by podiatrist for high BP. Pt reports BP at podiatry was something like 170/112. Pt ambulatory from lobby in NAD, skin warm and dry. Pt reports she takes BP meds and has been taking them regularly.  Pt denies CP, shob, headache.

## 2020-06-29 NOTE — ED Provider Notes (Signed)
  ER Provider Note       Time seen: 11:27 AM    I have reviewed the vital signs and the nursing notes.  HISTORY   Chief Complaint Hypertension    HPI Dana Hale is a 59 y.o. female with a history of arthritis, GERD, hypertension, spinal stenosis who presents today for hypertension.  Patient was sent by her podiatrist for same.  Blood pressure was elevated into the 170s at home.  She states typically it has been in the 140s, has longstanding history of hypertension and is on three different blood pressure medicines.  She feels like her blood pressure medicines need to be increased or changed.  Past Medical History:  Diagnosis Date  . Arthritis    lower back, knees  . GERD (gastroesophageal reflux disease)   . Hypertension   . Spinal stenosis     Past Surgical History:  Procedure Laterality Date  . BREAST EXCISIONAL BIOPSY Right 2018   benign  . CHOLECYSTECTOMY    . HERNIA REPAIR    . PITUITARY EXCISION    . TUBAL LIGATION      Allergies Amlodipine and Trilyte [peg 3350-kcl-na bicarb-nacl]  Review of Systems Constitutional: Negative for fever. Cardiovascular: Negative for chest pain. Respiratory: Negative for shortness of breath. Gastrointestinal: Negative for abdominal pain, vomiting and diarrhea. Musculoskeletal: Positive for chronic back pain Skin: Negative for rash. Neurological: Negative for headaches, focal weakness or numbness.  All systems negative/normal/unremarkable except as stated in the HPI  ____________________________________________   PHYSICAL EXAM:  VITAL SIGNS: Vitals:   06/29/20 0954  BP: (!) 178/109  Pulse: 71  Resp: 18  Temp: 98.8 F (37.1 C)  SpO2: 100%    Constitutional: Alert and oriented. Well appearing and in no distress. Eyes: Conjunctivae are normal. Normal extraocular movements. ENT      Head: Normocephalic and atraumatic.      Nose: No congestion/rhinnorhea.      Mouth/Throat: Mucous membranes are moist.       Neck: No stridor. Cardiovascular: Normal rate, regular rhythm. No murmurs, rubs, or gallops. Respiratory: Normal respiratory effort without tachypnea nor retractions. Breath sounds are clear and equal bilaterally. No wheezes/rales/rhonchi. Gastrointestinal: Soft and nontender. Normal bowel sounds Musculoskeletal: Nontender with normal range of motion in extremities. No lower extremity tenderness nor edema. Neurologic:  Normal speech and language. No gross focal neurologic deficits are appreciated.  Skin:  Skin is warm, dry and intact. No rash noted. Psychiatric: Speech and behavior are normal.   ____________________________________________   LABS (pertinent positives/negatives)  Labs Reviewed - No data to display   DIFFERENTIAL DIAGNOSIS  Asymptomatic hypertension, hypertension, renal failure, electrolyte abnormality  ASSESSMENT AND PLAN  Hypertension   Plan: The patient had presented for hypertension.  Initially had ordered labs for the patient but she has declined, stating she would rather follow-up with her primary care doctor.  I will increase her Atacand dose.  She is cleared for outpatient follow-up.  Daryel November MD    Note: This note was generated in part or whole with voice recognition software. Voice recognition is usually quite accurate but there are transcription errors that can and very often do occur. I apologize for any typographical errors that were not detected and corrected.     Emily Filbert, MD 06/29/20 [phone]

## 2020-12-22 ENCOUNTER — Other Ambulatory Visit: Payer: Self-pay | Admitting: Family Medicine

## 2020-12-22 DIAGNOSIS — Z1231 Encounter for screening mammogram for malignant neoplasm of breast: Secondary | ICD-10-CM

## 2021-01-20 ENCOUNTER — Inpatient Hospital Stay: Admission: RE | Admit: 2021-01-20 | Payer: Federal, State, Local not specified - PPO | Source: Ambulatory Visit

## 2021-03-15 ENCOUNTER — Ambulatory Visit
Admission: RE | Admit: 2021-03-15 | Discharge: 2021-03-15 | Disposition: A | Discharge status: Home / Self Care | Payer: Federal, State, Local not specified - PPO | Source: Ambulatory Visit | Attending: Family Medicine | Admitting: Family Medicine

## 2021-03-15 ENCOUNTER — Other Ambulatory Visit: Payer: Self-pay

## 2021-03-15 ENCOUNTER — Ambulatory Visit: Payer: Federal, State, Local not specified - PPO

## 2021-03-15 DIAGNOSIS — Z1231 Encounter for screening mammogram for malignant neoplasm of breast: Secondary | ICD-10-CM

## 2021-04-22 ENCOUNTER — Ambulatory Visit
Admission: RE | Admit: 2021-04-22 | Discharge: 2021-04-22 | Disposition: A | Discharge status: Home / Self Care | Payer: Federal, State, Local not specified - PPO | Source: Ambulatory Visit | Attending: Physician Assistant | Admitting: Physician Assistant

## 2021-04-22 ENCOUNTER — Ambulatory Visit (INDEPENDENT_AMBULATORY_CARE_PROVIDER_SITE_OTHER): Payer: Federal, State, Local not specified - PPO

## 2021-04-22 ENCOUNTER — Other Ambulatory Visit: Payer: Self-pay

## 2021-04-22 VITALS — BP 133/96 | HR 95 | Temp 98.2°F | Resp 18

## 2021-04-22 DIAGNOSIS — M7989 Other specified soft tissue disorders: Secondary | ICD-10-CM | POA: Diagnosis not present

## 2021-04-22 DIAGNOSIS — M7742 Metatarsalgia, left foot: Secondary | ICD-10-CM

## 2021-04-22 MED ORDER — DICLOFENAC SODIUM 1 % EX GEL: 2.0000 g | Freq: Four times a day (QID) | CUTANEOUS | 1 refills | Status: AC

## 2021-04-22 MED ORDER — NAPROXEN 500 MG PO TABS: 500.0000 mg | ORAL_TABLET | Freq: Two times a day (BID) | ORAL | 0 refills | Status: AC

## 2021-04-22 NOTE — ED Provider Notes (Signed)
[facility]    CSN: [id number] Arrival date & time: 04/22/21  1203      History   Chief Complaint Chief Complaint  Patient presents with  . Foot Pain    Left    HPI Dana Hale is a 60 y.o. female presenting for atraumatic left foot pain.  Patient states it has been hurting for the past 2 to 3 days.  She says that she is gradually noticed it.  She has most pain about the midfoot.  She also admits to some swelling.  Admits to difficulty bearing weight due to the pain.  Patient denies any numbness, weakness or tingling.  She denies any recent change in her activities.  States that she normally walks 2 to 3 miles a day.  States that she has sketchers with good arch support.  Patient has had to see podiatry in the past due to a bunion and also has history of plantar fasciitis.  Has taken at home Norco which has not really helped the pain.  No other concerns.  HPI  Past Medical History:  Diagnosis Date  . Arthritis    lower back, knees  . GERD (gastroesophageal reflux disease)   . Hypertension   . Spinal stenosis     There are no problems to display for this patient.   Past Surgical History:  Procedure Laterality Date  . BREAST CYST EXCISION Right 2018   benign  . CHOLECYSTECTOMY    . HERNIA REPAIR    . PITUITARY EXCISION    . TUBAL LIGATION      OB History   No obstetric history on file.      Home Medications    Prior to Admission medications   Medication Sig Start Date End Date Taking? Authorizing Provider  diclofenac Sodium (VOLTAREN) 1 % GEL Apply 2 g topically 4 (four) times daily for 10 days. 04/22/21 05/02/21 Yes Eusebio Friendly B, PA-C  BIOTIN PO Take by mouth daily.    [provider]  candesartan (ATACAND) 32 MG tablet Take 1 tablet (32 mg total) by mouth daily. 06/29/20 06/29/21  Emily Filbert, MD  Cyanocobalamin (VITAMIN B-12 PO) Take by mouth daily.    [provider]  HYDROcodone-acetaminophen (NORCO/VICODIN)  5-325 MG tablet Take 1 tablet by mouth every 6 (six) hours as needed for moderate pain.    [provider]  metaxalone (SKELAXIN) 800 MG tablet Take 800 mg by mouth 3 (three) times daily.    [provider]  metoprolol succinate (TOPROL-XL) 100 MG 24 hr tablet Take 100 mg by mouth daily. Take with or immediately following a meal.    [provider]  Multiple Vitamins-Minerals (MULTIVITAMIN WOMEN PO) Take by mouth daily.    [provider]  naproxen (NAPROSYN) 500 MG tablet Take 1 tablet (500 mg total) by mouth 2 (two) times daily with a meal for 15 days. 04/22/21 05/07/21  Shirlee Latch, PA-C  omeprazole (PRILOSEC) 40 MG capsule Take 40 mg by mouth daily.    [provider]  triamterene-hydrochlorothiazide (MAXZIDE-25) 37.5-25 MG tablet Take 1 tablet by mouth daily.    [provider]    Family History Family History  Problem Relation Age of Onset  . Diabetes Mother   . Hypertension Mother   . Breast cancer Neg Hx     Social History Social History   Tobacco Use  . Smoking status: Never Smoker  . Smokeless tobacco: Never Used  Vaping Use  . Vaping Use:  Never used  Substance Use Topics  . Alcohol use: Yes    Alcohol/week: 1.0 standard drink    Types: 1 Standard drinks or equivalent per week    Comment: occasionally  . Drug use: Not Currently     Allergies   Amlodipine and Trilyte [peg 3350-kcl-na bicarb-nacl]   Review of Systems Review of Systems  Musculoskeletal: Positive for arthralgias, gait problem and joint swelling.  Skin: Negative for color change and rash.  Neurological: Negative for weakness and numbness.     Physical Exam Triage Vital Signs ED Triage Vitals  Enc Vitals Group     BP 04/22/21 1230 (!) 133/96     Pulse Rate 04/22/21 1230 95     Resp 04/22/21 1230 18     Temp 04/22/21 1230 98.2 F (36.8 C)     Temp Source 04/22/21 1230 Oral     SpO2 04/22/21 1230 98 %     Weight --      Height --       Head Circumference --      Peak Flow --      Pain Score 04/22/21 1228 10     Pain Loc --      Pain Edu? --      Excl. in GC? --    No data found.  Updated Vital Signs BP (!) 133/96 (BP Location: Left Arm)   Pulse 95   Temp 98.2 F (36.8 C) (Oral)   Resp 18   LMP 12/05/2018 (Exact Date)   SpO2 98%       Physical Exam Vitals and nursing note reviewed.  Constitutional:      General: She is not in acute distress.    Appearance: Normal appearance. She is obese. She is not ill-appearing or toxic-appearing.  HENT:     Head: Normocephalic and atraumatic.  Eyes:     General: No scleral icterus.       Right eye: No discharge.        Left eye: No discharge.     Conjunctiva/sclera: Conjunctivae normal.  Cardiovascular:     Rate and Rhythm: Normal rate and regular rhythm.     Pulses: Normal pulses.  Pulmonary:     Effort: Pulmonary effort is normal. No respiratory distress.  Musculoskeletal:     Cervical back: Neck supple.     Left foot: Decreased range of motion. Swelling (mild swelling mid foot) and tenderness (diffuse TTP metatarsals) present. No deformity. Normal pulse.     Comments: No erythema or ecchymosis. No wounds/lacs/abrasions  Skin:    General: Skin is dry.  Neurological:     General: No focal deficit present.     Mental Status: She is alert. Mental status is at baseline.     Motor: No weakness.     Gait: Gait abnormal (patient using crutches).  Psychiatric:        Mood and Affect: Mood normal.        Behavior: Behavior normal.        Thought Content: Thought content normal.      UC Treatments / Results  Labs (all labs ordered are listed, but only abnormal results are displayed) Labs Reviewed - No data to display  EKG   Radiology DG Foot Complete Left  Result Date: 04/22/2021 CLINICAL DATA:  Pain and swelling. Pain is greatest medial to the navicular. EXAM: LEFT FOOT - COMPLETE 3+ VIEW COMPARISON:  None. FINDINGS: Hallux valgus deformity noted. No  acute bone or soft tissue abnormality  is present. Small plantar calcaneal spur is present. IMPRESSION: 1. Hallux valgus deformity. 2. No acute abnormality. Electronically Signed   By: Marin Roberts M.D.   On: 04/22/2021 12:53    Procedures Procedures (including critical care time)  Medications Ordered in UC Medications - No data to display  Initial Impression / Assessment and Plan / UC Course  I have reviewed the triage vital signs and the nursing notes.  Pertinent labs & imaging results that were available during my care of the patient were reviewed by me and considered in my medical decision making (see chart for details).   60 year old female presenting for left foot pain x2 days.  No injury.  X-ray obtained today due to her complaint of 10 out of 10 pain.  X-ray independently viewed by me.  X-ray shows small plantar calcaneal spur and hallux valgus deformity.  No fractures or other bony abnormality noted.  Reviewed results of x-ray with patient.  Advised her that her symptoms are consistent with metatarsalgia.  Suspect there may also be some level plantar fasciitis and pes planus playing into her pain.  At this time, advised NSAIDs.  I did send naproxen and Voltaren gel.  Also advised Tylenol for pain or she can take her at home Norco.  Advised icing the area.  Patient given Ace wrap.  Advised to follow-up with her podiatrist if not improving over the next week or if symptoms worsen.  ED for any severe worsening pain.   Final Clinical Impressions(s) / UC Diagnoses   Final diagnoses:  Metatarsalgia of left foot  Swelling of left foot     Discharge Instructions     LEFT FOOT PAIN: Stressed avoiding painful activities . Reviewed RICE guidelines. Use medications as directed, including NSAIDs. If no NSAIDs have been prescribed for you today, you may take Aleve or Motrin over the counter. May use Tylenol or Norco (at home med) in between doses of NSAIDs.  If no improvement in the  next 1-2 weeks, f/u with podiatry or return to our office for reexamination, and please feel free to call or return at any time for any questions or concerns you may have and we will be happy to help you!        ED Prescriptions    Medication Sig Dispense Auth. Provider   naproxen (NAPROSYN) 500 MG tablet Take 1 tablet (500 mg total) by mouth 2 (two) times daily with a meal for 15 days. 30 tablet Eusebio Friendly B, PA-C   diclofenac Sodium (VOLTAREN) 1 % GEL Apply 2 g topically 4 (four) times daily for 10 days. 50 g Gareth Morgan     PDMP not reviewed this encounter.   Shirlee Latch, PA-C 04/22/21 1320

## 2021-04-22 NOTE — ED Triage Notes (Signed)
Pt states that she noticed a couple days ago her left foot started hurting.  Pt describes the pain as being sharp and throbbing. Pt states that she does notice swelling on both side of her foot and it is painful to bare weight on it. Pt denies any injury

## 2021-04-22 NOTE — Discharge Instructions (Addendum)
LEFT FOOT PAIN: Stressed avoiding painful activities . Reviewed RICE guidelines. Use medications as directed, including NSAIDs. If no NSAIDs have been prescribed for you today, you may take Aleve or Motrin over the counter. May use Tylenol or Norco (at home med) in between doses of NSAIDs.  If no improvement in the next 1-2 weeks, f/u with podiatry or return to our office for reexamination, and please feel free to call or return at any time for any questions or concerns you may have and we will be happy to help you!

## 2021-08-23 IMAGING — CR DG FOOT COMPLETE 3+V*L*
3 series · 3 of 3 positions shown · non-contrast
Comparison: None.

CLINICAL DATA: Pain and swelling. Pain is greatest medial to the
navicular.

EXAM:
LEFT FOOT - COMPLETE 3+ VIEW

[foot ap]
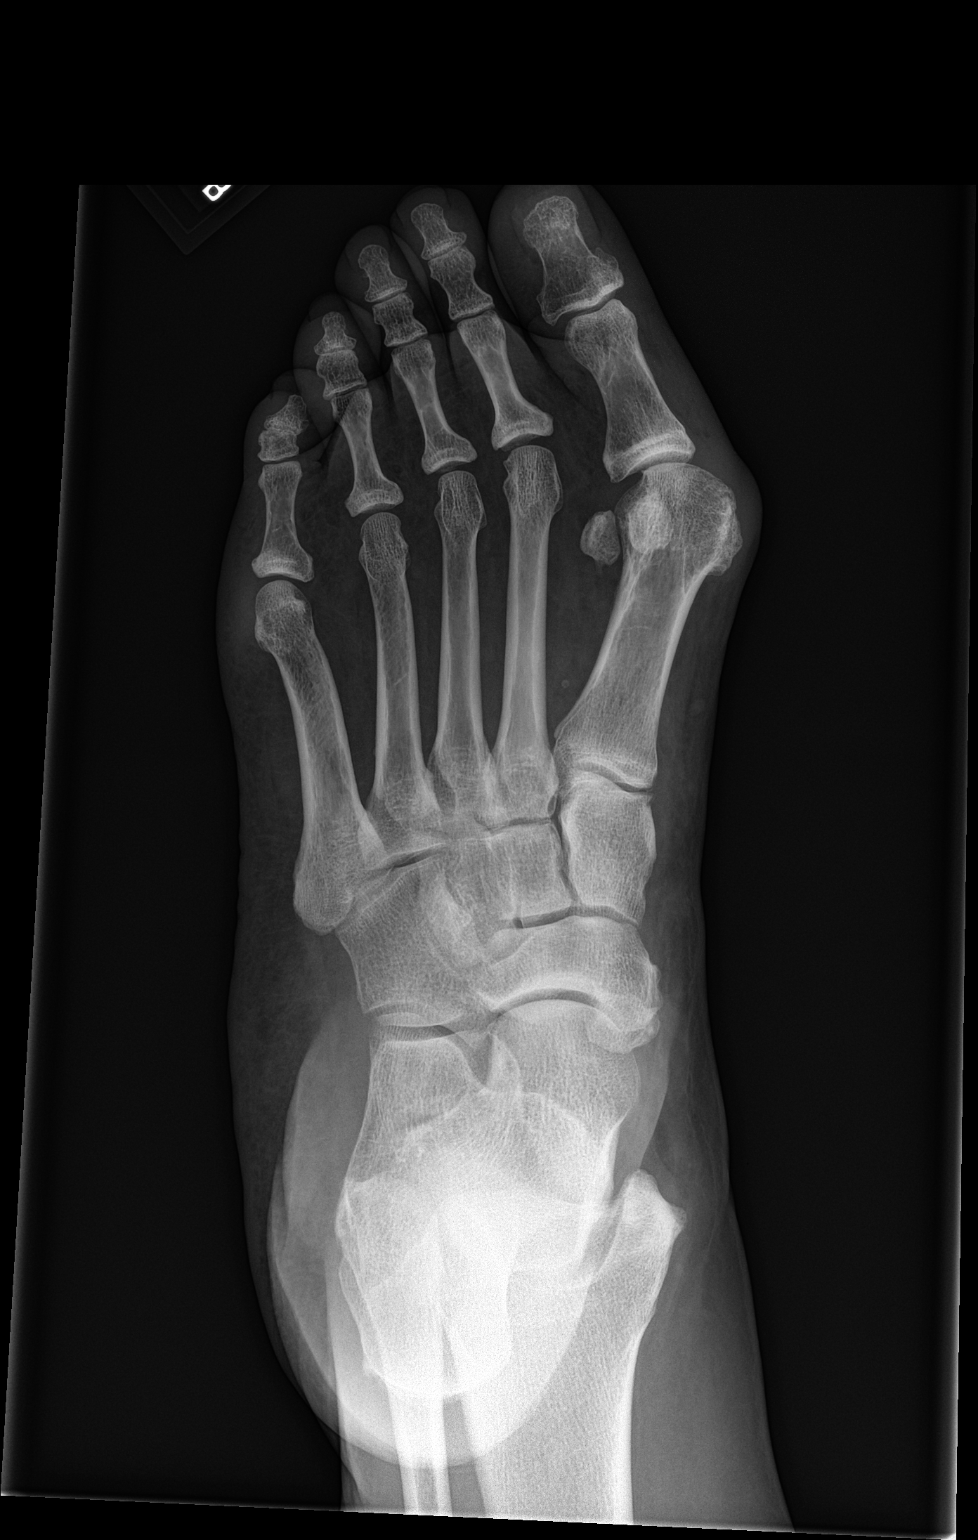

[foot lat]
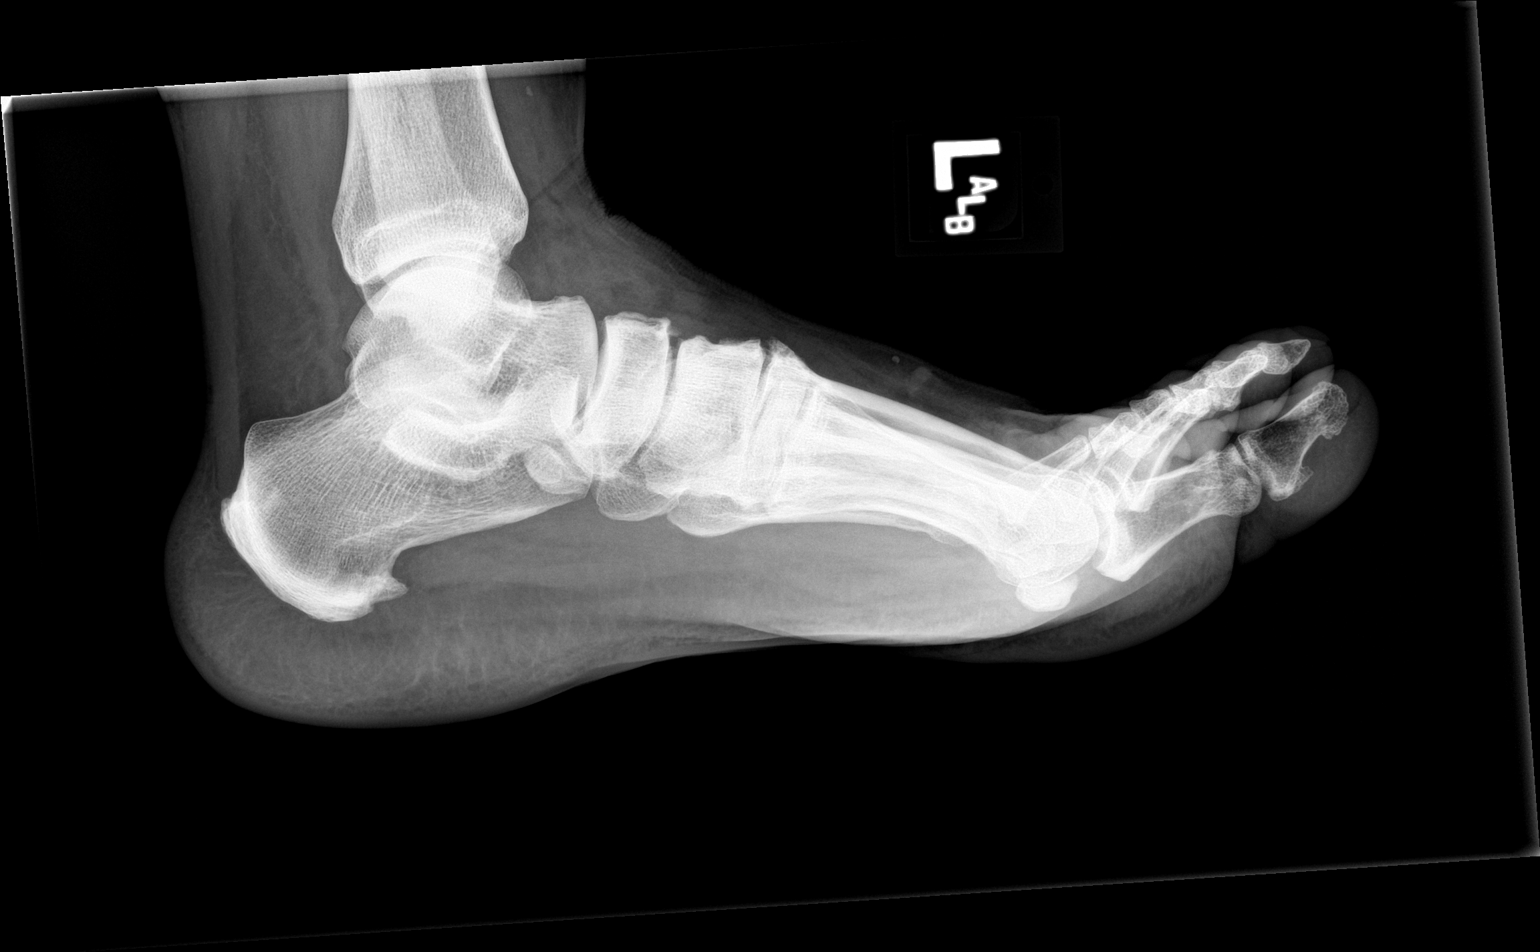

[foot obl]
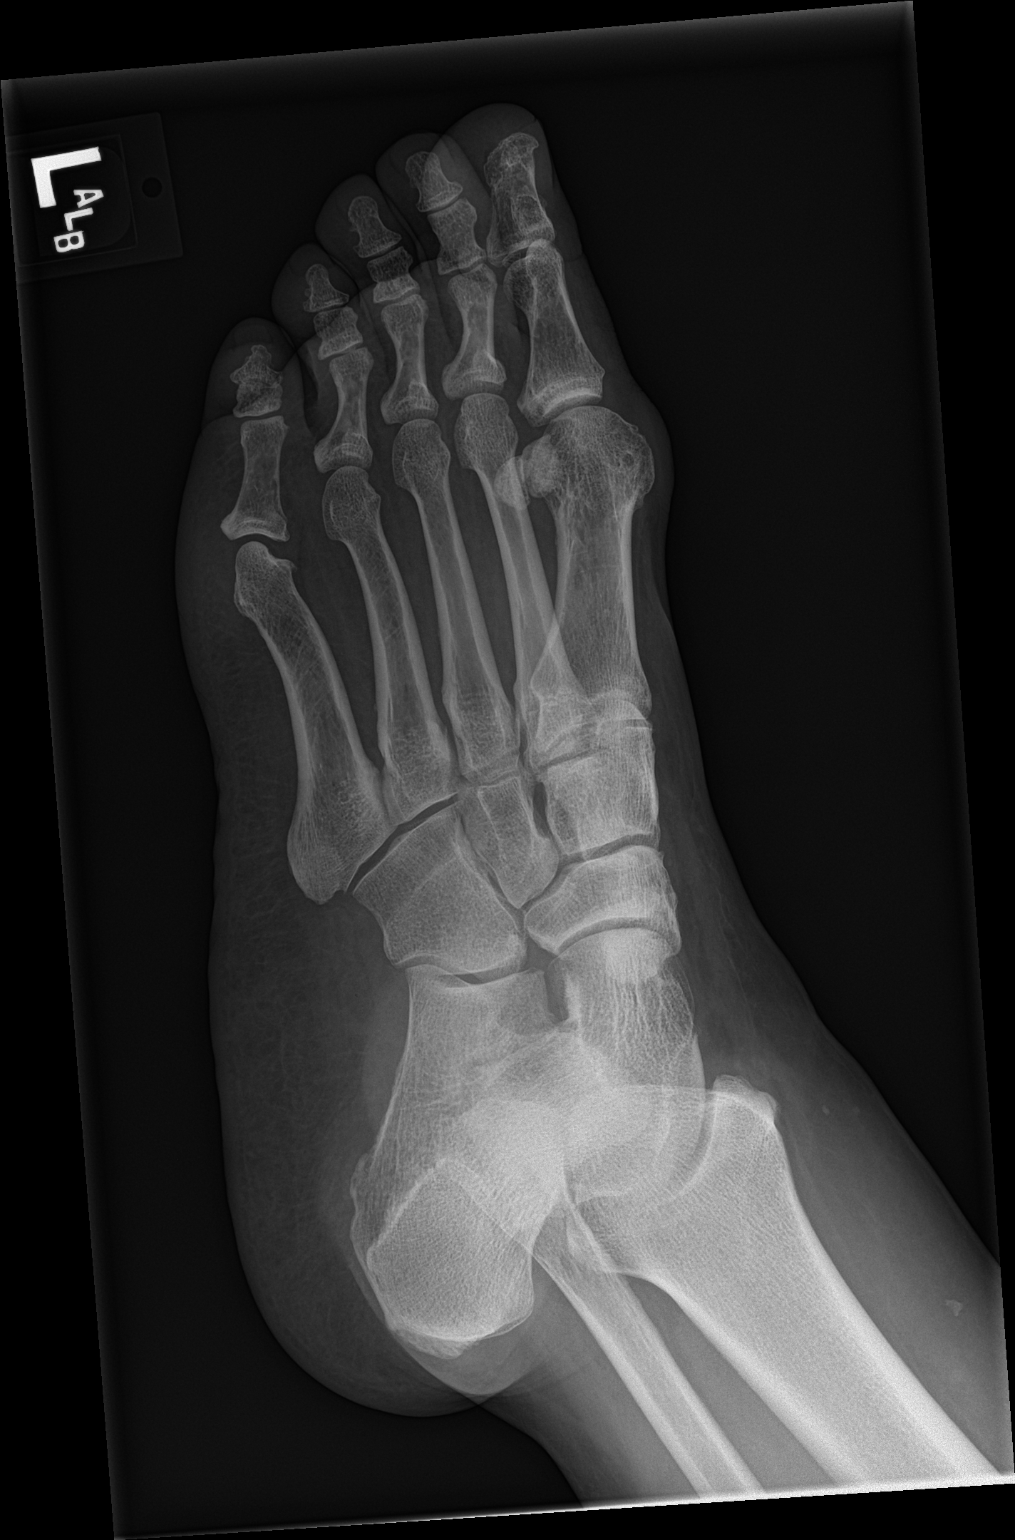

[3 of 3 positions shown; findings below may reference images not displayed]

FINDINGS: Hallux valgus deformity noted. No acute bone or soft tissue
abnormality is present. Small plantar calcaneal spur is present.
IMPRESSION: 1. Hallux valgus deformity.
2. No acute abnormality.

## 2022-02-27 ENCOUNTER — Other Ambulatory Visit: Payer: Self-pay | Admitting: Family Medicine

## 2022-02-27 DIAGNOSIS — Z1231 Encounter for screening mammogram for malignant neoplasm of breast: Secondary | ICD-10-CM

## 2022-04-06 ENCOUNTER — Ambulatory Visit: Payer: Federal, State, Local not specified - PPO

## 2022-06-27 ENCOUNTER — Ambulatory Visit
Admission: RE | Admit: 2022-06-27 | Discharge: 2022-06-27 | Disposition: A | Discharge status: Home / Self Care | Payer: Federal, State, Local not specified - PPO | Source: Ambulatory Visit | Attending: Family Medicine | Admitting: Family Medicine

## 2022-06-27 DIAGNOSIS — Z1231 Encounter for screening mammogram for malignant neoplasm of breast: Secondary | ICD-10-CM | POA: Diagnosis present

## 2022-07-26 ENCOUNTER — Ambulatory Visit: Payer: Federal, State, Local not specified - PPO

## 2022-12-25 ENCOUNTER — Ambulatory Visit (INDEPENDENT_AMBULATORY_CARE_PROVIDER_SITE_OTHER): Payer: Federal, State, Local not specified - PPO

## 2022-12-25 ENCOUNTER — Ambulatory Visit
Admission: EM | Admit: 2022-12-25 | Discharge: 2022-12-25 | Disposition: A | Discharge status: Home / Self Care | Payer: Federal, State, Local not specified - PPO

## 2022-12-25 ENCOUNTER — Other Ambulatory Visit: Payer: Self-pay

## 2022-12-25 DIAGNOSIS — S0993XA Unspecified injury of face, initial encounter: Secondary | ICD-10-CM | POA: Diagnosis not present

## 2022-12-25 DIAGNOSIS — S00511A Abrasion of lip, initial encounter: Secondary | ICD-10-CM | POA: Diagnosis not present

## 2022-12-25 DIAGNOSIS — Z23 Encounter for immunization: Secondary | ICD-10-CM | POA: Diagnosis not present

## 2022-12-25 DIAGNOSIS — W19XXXA Unspecified fall, initial encounter: Secondary | ICD-10-CM | POA: Diagnosis not present

## 2022-12-25 MED ORDER — KETOROLAC TROMETHAMINE 30 MG/ML IJ SOLN
30.0000 mg | Freq: Once | INTRAMUSCULAR | Status: AC
  Administered 2022-12-25: 30 mg via INTRAMUSCULAR

## 2022-12-25 MED ORDER — MELOXICAM 7.5 MG PO TABS: 7.5000 mg | ORAL_TABLET | Freq: Every day | ORAL | 0 refills | Status: DC

## 2022-12-25 MED ORDER — TETANUS-DIPHTH-ACELL PERTUSSIS 5-2.5-18.5 LF-MCG/0.5 IM SUSY
0.5000 mL | PREFILLED_SYRINGE | Freq: Once | INTRAMUSCULAR | Status: AC
  Administered 2022-12-25: 0.5 mL via INTRAMUSCULAR

## 2022-12-25 NOTE — ED Triage Notes (Addendum)
Pt was taking out trash last evening and fell onto concrete driveway face-first. Left eye bruising, left jaw pain, chipped tooth, chin pain, lip swelling and upper lip laceration. No LOC. Denies neck pain

## 2022-12-25 NOTE — Discharge Instructions (Addendum)
Area of concern to the lip is an abrasion/scrape, unable to be sutured and you will not need stitches, should heal with time with small scarring  Area has been cleansed here in office and topical antibiotic cream applied, you may cleanse daily at home using diluted soapy water during normal hygiene, pat dry and may leave open to air, at any point if you begin to see increased swelling, increased pain, puslike drainage, fever or chills please return for reevaluation of infection  You have been given a tetanus booster  X-ray of your facial bones is negative for break  You have been given an injection of Toradol today here in the office to help reduce swelling and help with general pain, ideally you will see improvement in about 30 minutes  You may use meloxicam every day as needed to further help with swelling and pain  You may use ice over your lip and eye in 10 to 15-minute intervals to help reduce swelling and for general comfort  At any point if you begin to have a severe headache (the worst of your life) dizziness, lightheadedness, visual changes, vomiting, memory speech changes or weakness please go to the nearest emergency department for evaluation for concussion

## 2022-12-25 NOTE — ED Provider Notes (Signed)
[facility]    CSN: [id number] Arrival date & time: 12/25/22  1128      History   Chief Complaint Chief Complaint  Patient presents with   Fall   Facial Injury   Head Injury    HPI Dana Hale is a 62 y.o. female.   Patient presents for evaluation of upper lip swelling and laceration, left eye bruising, left jaw pain, chin pain and a chipped tooth beginning 1 day ago after fall.  Was walking the driveway when she believes she tripped falling face forward, unable to catch self, denies loss of consciousness or hitting head.  Has cleansed the area at home covered with Band-Aid.  Denies dizziness, lightheadedness, visual changes, memory or speech changes or weakness.   Past Medical History:  Diagnosis Date   Arthritis    lower back, knees   GERD (gastroesophageal reflux disease)    Hypertension    Spinal stenosis     There are no problems to display for this patient.   Past Surgical History:  Procedure Laterality Date   BREAST CYST EXCISION Right 2018   benign   CHOLECYSTECTOMY     HERNIA REPAIR     PITUITARY EXCISION     TUBAL LIGATION      OB History   No obstetric history on file.      Home Medications    Prior to Admission medications   Medication Sig Start Date End Date Taking? Authorizing Provider  atorvastatin (LIPITOR) 20 MG tablet Take 40 mg by mouth daily.   Yes [provider]  escitalopram (LEXAPRO) 5 MG tablet TAKE 1 TABLET (5 MG TOTAL) BY MOUTH EVERY DAY 09/28/22  Yes [provider]  meloxicam (MOBIC) 7.5 MG tablet Take 1 tablet (7.5 mg total) by mouth daily. 12/25/22  Yes Hans Eden, NP  metFORMIN (GLUCOPHAGE-XR) 500 MG 24 hr tablet Take 1 tablet by mouth daily with breakfast. 10/18/22 10/18/23 Yes [provider]  omeprazole (PRILOSEC) 40 MG capsule Take 1 tablet by mouth daily. 09/16/19  Yes [provider]  BIOTIN PO Take by mouth daily.    [provider]  candesartan  (ATACAND) 32 MG tablet Take 1 tablet (32 mg total) by mouth daily. 06/29/20 06/29/21  Earleen Newport, MD  Cyanocobalamin (VITAMIN B-12 PO) Take by mouth daily.    [provider]  diclofenac Sodium (VOLTAREN) 1 % GEL SMARTSIG:2 Topical Twice Daily    [provider]  HYDROcodone-acetaminophen (NORCO/VICODIN) 5-325 MG tablet Take 1 tablet by mouth every 6 (six) hours as needed for moderate pain.    [provider]  lisinopril (ZESTRIL) 20 MG tablet Take 20 mg by mouth daily.    [provider]  metaxalone (SKELAXIN) 800 MG tablet Take 800 mg by mouth 3 (three) times daily.    [provider]  metoprolol succinate (TOPROL-XL) 100 MG 24 hr tablet Take 100 mg by mouth daily. Take with or immediately following a meal.    [provider]  Multiple Vitamins-Minerals (MULTIVITAMIN WOMEN PO) Take by mouth daily.    [provider]  omeprazole (PRILOSEC) 40 MG capsule Take 40 mg by mouth daily.    [provider]  spironolactone (ALDACTONE) 50 MG tablet Take 50 mg by mouth daily.    [provider]  triamterene-hydrochlorothiazide (MAXZIDE-25) 37.5-25 MG tablet Take 1 tablet by mouth daily.    [provider]    Family History Family History  Problem Relation Age of Onset  Diabetes Mother    Hypertension Mother    Breast cancer Neg Hx     Social History Social History   Tobacco Use   Smoking status: Never   Smokeless tobacco: Never  Vaping Use   Vaping Use: Never used  Substance Use Topics   Alcohol use: Yes    Alcohol/week: 1.0 standard drink of alcohol    Types: 1 Standard drinks or equivalent per week    Comment: occasionally   Drug use: Not Currently     Allergies   Amlodipine and Trilyte [peg 3350-kcl-na bicarb-nacl]   Review of Systems Review of Systems   Physical Exam Triage Vital Signs ED Triage Vitals  Enc Vitals Group     BP 12/25/22 1230 (!) 170/105     Pulse Rate  12/25/22 1230 83     Resp 12/25/22 1230 20     Temp 12/25/22 1230 98.4 F (36.9 C)     Temp Source 12/25/22 1230 Oral     SpO2 12/25/22 1230 97 %     Weight 12/25/22 1225 225 lb (102.1 kg)     Height 12/25/22 1225 5\' 4"  (1.626 m)     Head Circumference --      Peak Flow --      Pain Score 12/25/22 1224 6     Pain Loc --      Pain Edu? --      Excl. in GC? --    No data found.  Updated Vital Signs BP (!) 170/105 (BP Location: Left Arm)   Pulse 83   Temp 98.4 F (36.9 C) (Oral)   Resp 20   Ht 5\' 4"  (1.626 m)   Wt 225 lb (102.1 kg)   LMP 12/05/2018 (Exact Date)   SpO2 97%   BMI 38.62 kg/m   Visual Acuity Right Eye Distance:   Left Eye Distance:   Bilateral Distance:    Right Eye Near:   Left Eye Near:    Bilateral Near:     Physical Exam HENT:     Head:     Comments: 0.5 x 0.5 cm abrasion present to the center of the upper lip, significant swelling to the upper lip  Abrasion present to the left lower periorbital region with mild ecchymosis, nontender, no swelling noted, vision grossly intact, extraocular movements intact  No abnormalities to the bilateral, has full movement      UC Treatments / Results  Labs (all labs ordered are listed, but only abnormal results are displayed) Labs Reviewed - No data to display  EKG   Radiology DG Facial Bones Complete  Result Date: 12/25/2022 CLINICAL DATA:  Fall EXAM: FACIAL BONES COMPLETE 3+V COMPARISON:  None Available. FINDINGS: There is no evidence of fracture or other significant bone abnormality. No orbital emphysema or sinus air-fluid levels are seen. IMPRESSION: No fracture identified by plain film. Electronically Signed   By: 12/07/2018 M.D.   On: 12/25/2022 13:00    Procedures Procedures (including critical care time)  Medications Ordered in UC Medications  ketorolac (TORADOL) 30 MG/ML injection 30 mg (has no administration in time range)  Tdap (BOOSTRIX) injection 0.5 mL (0.5 mLs Intramuscular  Given 12/25/22 1239)    Initial Impression / Assessment and Plan / UC Course  I have reviewed the triage vital signs and the nursing notes.  Pertinent labs & imaging results that were available during my care of the patient were reviewed by me and considered in my medical decision making (see chart for  details).  Lip abrasion, initial encounter, facial injury, initial encounter, fall, initial encounter  Facial x-ray negative, discussed findings, cleaned abrasion with antiseptic cleanser and apply topical Bactroban, advised daily cleansing with diluted soapy water, pat dry and leaving open to air, given strict precautions for signs of infection to return for reevaluation, tetanus injection given, recommend ice over the affected area to help reduce swelling, Toradol injection given in office and prescribed meloxicam for outpatient use, may follow-up with urgent care or PCP as needed Final Clinical Impressions(s) / UC Diagnoses   Final diagnoses:  Lip abrasion, initial encounter  Facial injury, initial encounter  Fall, initial encounter     Discharge Instructions      Area of concern to the lip is an abrasion/scrape, unable to be sutured and you will not need stitches, should heal with time with small scarring  Area has been cleansed here in office and topical antibiotic cream applied, you may cleanse daily at home using diluted soapy water during normal hygiene, pat dry and may leave open to air, at any point if you begin to see increased swelling, increased pain, puslike drainage, fever or chills please return for reevaluation of infection  You have been given a tetanus booster  X-ray of your facial bones is negative for break  You have been given an injection of Toradol today here in the office to help reduce swelling and help with general pain, ideally you will see improvement in about 30 minutes  You may use meloxicam every day as needed to further help with swelling and pain  You  may use ice over your lip and eye in 10 to 15-minute intervals to help reduce swelling and for general comfort  At any point if you begin to have a severe headache (the worst of your life) dizziness, lightheadedness, visual changes, vomiting, memory speech changes or weakness please go to the nearest emergency department for evaluation for concussion   ED Prescriptions     Medication Sig Dispense Auth. Provider   meloxicam (MOBIC) 7.5 MG tablet Take 1 tablet (7.5 mg total) by mouth daily. 30 tablet Hans Eden, NP      PDMP not reviewed this encounter.   Hans Eden, NP 12/25/22 1339

## 2023-07-04 ENCOUNTER — Other Ambulatory Visit: Payer: Self-pay | Admitting: Internal Medicine

## 2023-07-04 DIAGNOSIS — Z1231 Encounter for screening mammogram for malignant neoplasm of breast: Secondary | ICD-10-CM

## 2023-07-31 ENCOUNTER — Ambulatory Visit
Admission: RE | Admit: 2023-07-31 | Discharge: 2023-07-31 | Disposition: A | Discharge status: Home / Self Care | Payer: Federal, State, Local not specified - PPO | Source: Ambulatory Visit | Attending: Internal Medicine | Admitting: Internal Medicine

## 2023-07-31 DIAGNOSIS — Z1231 Encounter for screening mammogram for malignant neoplasm of breast: Secondary | ICD-10-CM | POA: Diagnosis present

## 2024-06-20 ENCOUNTER — Ambulatory Visit: Payer: Self-pay | Admitting: Family Medicine

## 2024-06-20 ENCOUNTER — Ambulatory Visit (INDEPENDENT_AMBULATORY_CARE_PROVIDER_SITE_OTHER)

## 2024-06-20 ENCOUNTER — Ambulatory Visit
Admission: RE | Admit: 2024-06-20 | Discharge: 2024-06-20 | Disposition: A | Discharge status: Home / Self Care | Source: Ambulatory Visit | Attending: Family Medicine | Admitting: Family Medicine

## 2024-06-20 VITALS — BP 153/93 | HR 70 | Temp 98.2°F | Resp 15 | Ht 64.0 in | Wt 225.1 lb

## 2024-06-20 DIAGNOSIS — M25572 Pain in left ankle and joints of left foot: Secondary | ICD-10-CM

## 2024-06-20 DIAGNOSIS — S91012A Laceration without foreign body, left ankle, initial encounter: Secondary | ICD-10-CM

## 2024-06-20 MED ORDER — CEPHALEXIN 500 MG PO CAPS
500.0000 mg | ORAL_CAPSULE | Freq: Three times a day (TID) | ORAL | 0 refills | Status: AC
Start: 2024-06-20 — End: 2024-06-30

## 2024-06-20 NOTE — ED Triage Notes (Signed)
 Last Saturday. Patient states that she was moving the garbage can and cut the back of her achilles area of her left foot.  Patient c/o left ankle pain and swelling since the incident.  Patient denies any recent drainage from the site.  Patient denies fevers.

## 2024-06-20 NOTE — ED Provider Notes (Signed)
 [facility]    CSN: [id number] Arrival date & time: 06/20/24  1053      History   Chief Complaint Chief Complaint  Patient presents with   Ankle Pain    Appointment    HPI  HPI Dana Hale is a 63 y.o. female.   Dana Hale presents for right ankle pain after dropping the recycle bin on the garage floor. The bin hit he back of her ankle. She washed the cut with peroxide and applied neosporin. Has been having pain with movement. There swelling started the next day.  No prevous ankle injury.      ***Injury occurred, mechanism and which ***shoulder injured.  Reports *** immediate pain in his shoulder ***. Did ***not hear any pop or abnormal sounds with his injury.  Continues to have *** description pain when moving his arm forward. Denies ***redness, swelling. Does not feel like her ***arm is weak. Has tried *** with little relief.  ***No change in pain day vs night.  Has ***never injured this *** shoulder before.  ***she is ***right handed.    Fever : no  Sore throat: no   Cough: no Appetite: normal  Hydration: normal  Abdominal pain: no Nausea: no Vomiting: no Sleep disturbance: no *** Back Pain: no Headache: no     Past Medical History:  Diagnosis Date   Arthritis    lower back, knees   GERD (gastroesophageal reflux disease)    Hypertension    Spinal stenosis     There are no active problems to display for this patient.   Past Surgical History:  Procedure Laterality Date   BREAST CYST EXCISION Right 2018   benign   CHOLECYSTECTOMY     HERNIA REPAIR     PITUITARY EXCISION     TUBAL LIGATION      OB History   No obstetric history on file.      Home Medications    Prior to Admission medications   Medication Sig Start Date End Date Taking? Authorizing Provider  buPROPion (WELLBUTRIN XL) 150 MG 24 hr tablet Take 150 mg by mouth daily. 11/30/23 11/29/24 Yes [provider]  gabapentin (NEURONTIN) 100 MG capsule Take 100 mg by  mouth. 01/05/20  Yes [provider]  atorvastatin (LIPITOR) 20 MG tablet Take 40 mg by mouth daily.    [provider]  BIOTIN PO Take by mouth daily.    [provider]  candesartan  (ATACAND ) 32 MG tablet Take 1 tablet (32 mg total) by mouth daily. 06/29/20 06/29/21  Trudy Dorn BRAVO, MD  Cyanocobalamin (VITAMIN B-12 PO) Take by mouth daily.    [provider]  diclofenac  Sodium (VOLTAREN ) 1 % GEL SMARTSIG:2 Topical Twice Daily    [provider]  escitalopram (LEXAPRO) 5 MG tablet TAKE 1 TABLET (5 MG TOTAL) BY MOUTH EVERY DAY 09/28/22   [provider]  HYDROcodone-acetaminophen (NORCO/VICODIN) 5-325 MG tablet Take 1 tablet by mouth every 6 (six) hours as needed for moderate pain.    [provider]  lisinopril (ZESTRIL) 20 MG tablet Take 20 mg by mouth daily.    [provider]  meloxicam  (MOBIC ) 7.5 MG tablet Take 1 tablet (7.5 mg total) by mouth daily. 12/25/22   Teresa Shelba SAUNDERS, NP  metaxalone (SKELAXIN) 800 MG tablet Take 800 mg by mouth 3 (three) times daily.    [provider]  metFORMIN (GLUCOPHAGE-XR) 500 MG 24 hr tablet Take 1 tablet by mouth daily with breakfast. 10/18/22 10/18/23  [provider]  metoprolol succinate (TOPROL-XL) 100 MG 24 hr tablet Take 100 mg by mouth daily. Take with or immediately following a meal.    [provider]  Multiple Vitamins-Minerals (MULTIVITAMIN WOMEN PO) Take by mouth daily.    [provider]  omeprazole (PRILOSEC) 40 MG capsule Take 40 mg by mouth daily.    [provider]  omeprazole (PRILOSEC) 40 MG capsule Take 1 tablet by mouth daily. 09/16/19   [provider]  spironolactone (ALDACTONE) 50 MG tablet Take 50 mg by mouth daily.    [provider]  triamterene-hydrochlorothiazide (MAXZIDE-25) 37.5-25 MG tablet Take 1 tablet by mouth daily.    [provider]    Family History Family History  Problem  Relation Age of Onset   Diabetes Mother    Hypertension Mother    Breast cancer Neg Hx     Social History Social History   Tobacco Use   Smoking status: Never   Smokeless tobacco: Never  Vaping Use   Vaping status: Never Used  Substance Use Topics   Alcohol use: Yes    Alcohol/week: 1.0 standard drink of alcohol    Types: 1 Standard drinks or equivalent per week    Comment: occasionally   Drug use: Not Currently     Allergies   Amlodipine and Trilyte [peg 3350-kcl-na bicarb-nacl]   Review of Systems Review of Systems: :negative unless otherwise stated in HPI.      Physical Exam Triage Vital Signs ED Triage Vitals  Encounter Vitals Group     BP 06/20/24 1107 (!) 153/93     Girls Systolic BP Percentile --      Girls Diastolic BP Percentile --      Boys Systolic BP Percentile --      Boys Diastolic BP Percentile --      Pulse Rate 06/20/24 1107 70     Resp 06/20/24 1107 15     Temp 06/20/24 1107 98.2 F (36.8 C)     Temp Source 06/20/24 1107 Oral     SpO2 06/20/24 1107 98 %     Weight 06/20/24 1105 225 lb 1.4 oz (102.1 kg)     Height 06/20/24 1105 5' 4 (1.626 m)     Head Circumference --      Peak Flow --      Pain Score 06/20/24 1105 7     Pain Loc --      Pain Education --      Exclude from Growth Chart --    No data found.  Updated Vital Signs BP (!) 153/93 (BP Location: Left Arm)   Pulse 70   Temp 98.2 F (36.8 C) (Oral)   Resp 15   Ht 5' 4 (1.626 m)   Wt 102.1 kg   LMP 12/05/2018 (Exact Date)   SpO2 98%   BMI 38.64 kg/m   Visual Acuity Right Eye Distance:   Left Eye Distance:   Bilateral Distance:    Right Eye Near:   Left Eye Near:    Bilateral Near:     Physical Exam GEN: well appearing female in no acute distress  CVS: well perfused  RESP: speaking in full sentences without pause, no respiratory distress  MSK:   *** shoulder:  No evidence of bony deformity, asymmetry, or muscle atrophy. No tenderness over long head of  biceps (bicipital groove).  No TTP at [employer] joint.  Full active and passive (ABD, ADD, Flexion, extension, IR, ER). Limited *** 2/2  to pain.  Strength 5/5 grip, elbow and shoulder. No abnormal scapular function observed.  Special Tests: Vonzell: ***; Empty Can: ***, Neer's: Negative; Painful arc: Negative; Anterior Apprehension: Negative Sensation intact. Peripheral pulses intact.   UC Treatments / Results  Labs (all labs ordered are listed, but only abnormal results are displayed) Labs Reviewed - No data to display  EKG   Radiology No results found.   Procedures Procedures (including critical care time)  Medications Ordered in UC Medications - No data to display  Initial Impression / Assessment and Plan / UC Course  I have reviewed the triage vital signs and the nursing notes.  Pertinent labs & imaging results that were available during my care of the patient were reviewed by me and considered in my medical decision making (see chart for details).      Pt is a 63 y.o.  female with *** days of *** shoulder pain after ***.   On exam, pt has tenderness at *** concerning for ***.   Obtained *** shoulder plain films.  Personally interpreted by me were ***unremarkable for fracture or dislocation. Radiologist report reviewed and additionally notes *** no soft tissue swelling.  Given ***Toradol  IM/sling/brace/crutches  Patient to gradually return to normal activities, as tolerated and continue ordinary activities within the limits permitted by pain. Prescribed Naproxen  sodium *** and muscle relaxer *** for pain relief.  Tylenol PRN. Advised patient to avoid OTC NSAIDs while taking prescription NSAID. Counseled patient on red flag symptoms and when to seek immediate care.  ***No red flags such as progressive major motor weakness.   Patient to follow up with orthopedic provider, if symptoms do not improve with conservative treatment.  Return and ED precautions given. Understanding voiced.  Discussed MDM, treatment plan and plan for follow-up with patient/parent who agrees with plan.   Final Clinical Impressions(s) / UC Diagnoses   Final diagnoses:  None   Discharge Instructions   None    ED Prescriptions   None    PDMP not reviewed this encounter.

## 2024-06-20 NOTE — Discharge Instructions (Signed)
 Stop by the pharmacy to pick up your prescriptions.  Follow up with a podiatrist, if not improving.  Consider Dr Selinda Gay or [facility].   Follow up with [facility] in [place].   Call [phone] [address] [place], [place] [address]

## 2024-07-14 ENCOUNTER — Ambulatory Visit
Admission: RE | Admit: 2024-07-14 | Discharge: 2024-07-14 | Disposition: A | Discharge status: Home / Self Care | Source: Ambulatory Visit | Attending: Family Medicine | Admitting: Family Medicine

## 2024-07-14 VITALS — BP 120/64 | HR 76 | Temp 98.5°F | Resp 17

## 2024-07-14 DIAGNOSIS — N898 Other specified noninflammatory disorders of vagina: Secondary | ICD-10-CM | POA: Diagnosis present

## 2024-07-14 DIAGNOSIS — N3001 Acute cystitis with hematuria: Secondary | ICD-10-CM | POA: Insufficient documentation

## 2024-07-14 LAB — URINALYSIS, W/ REFLEX TO CULTURE (INFECTION SUSPECTED)
Bilirubin Urine: NEGATIVE
Glucose, UA: NEGATIVE mg/dL
Ketones, ur: NEGATIVE mg/dL
Nitrite: NEGATIVE
Protein, ur: 30 mg/dL — AB
Specific Gravity, Urine: 1.015 (ref 1.005–1.030)
WBC, UA: >50 WBC/hpf (ref 0–5)
pH: 5.5 (ref 5.0–8.0)

## 2024-07-14 MED ORDER — NITROFURANTOIN MONOHYD MACRO 100 MG PO CAPS: 100.0000 mg | ORAL_CAPSULE | Freq: Two times a day (BID) | ORAL | 0 refills | Status: AC

## 2024-07-14 NOTE — ED Provider Notes (Signed)
 [facility]    CSN: [id number] Arrival date & time: 07/14/24  9066      History   Chief Complaint Chief Complaint  Patient presents with   Vaginal Itching    Entered by patient     HPI HPI Dana Hale is a 63 y.o. female.    Dana Hale presents for 1 week of vaginal itching and dysuria with pelvic cramping .  Symptoms felt better but then came back.  She cancelled her appt with her PCP but symptoms returned so she came to the urgent care. Tried nothing prior to arrival.  Has had any antibiotics in last 30 days after having a cut on her foot.   Denies known STI exposure. Patient's last menstrual period was 12/05/2018 (exact date).    - Abnormal vaginal discharge: no - vaginal odor: no - vaginal bleeding: no - Dysuria: yes - Hematuria: no - History of kidney stones: no  - Urinary urgency: yes  - Urinary frequency: yes   - Fever / Chills: no  - Abdominal pain: no  - Pelvic pain: yes - Rash/Skin lesions/mouth ulcers: no - Nausea: no  - Vomiting: no  - Back Pain: not new        Past Medical History:  Diagnosis Date   Arthritis    lower back, knees   GERD (gastroesophageal reflux disease)    Hypertension    Spinal stenosis     There are no active problems to display for this patient.   Past Surgical History:  Procedure Laterality Date   BREAST CYST EXCISION Right 2018   benign   CHOLECYSTECTOMY     HERNIA REPAIR     PITUITARY EXCISION     TUBAL LIGATION      OB History   No obstetric history on file.      Home Medications    Prior to Admission medications   Medication Sig Start Date End Date Taking? Authorizing Provider  atorvastatin (LIPITOR) 20 MG tablet Take 40 mg by mouth daily.   Yes [provider]  buPROPion (WELLBUTRIN XL) 150 MG 24 hr tablet Take 150 mg by mouth daily. 11/30/23 11/29/24 Yes [provider]  escitalopram (LEXAPRO) 5 MG tablet TAKE 1 TABLET (5 MG TOTAL) BY MOUTH EVERY DAY  09/28/22  Yes [provider]  gabapentin (NEURONTIN) 100 MG capsule Take 100 mg by mouth. 01/05/20  Yes [provider]  HYDROcodone-acetaminophen (NORCO/VICODIN) 5-325 MG tablet Take 1 tablet by mouth every 6 (six) hours as needed for moderate pain.   Yes [provider]  lisinopril (ZESTRIL) 20 MG tablet Take 20 mg by mouth daily.   Yes [provider]  metFORMIN (GLUCOPHAGE-XR) 500 MG 24 hr tablet Take 1 tablet by mouth daily with breakfast. 10/18/22 07/14/24 Yes [provider]  metoprolol succinate (TOPROL-XL) 100 MG 24 hr tablet Take 100 mg by mouth daily. Take with or immediately following a meal.   Yes [provider]  nitrofurantoin , macrocrystal-monohydrate, (MACROBID ) 100 MG capsule Take 1 capsule (100 mg total) by mouth 2 (two) times daily. 07/14/24  Yes Tracye Szuch, DO  BIOTIN PO Take by mouth daily.    [provider]  candesartan  (ATACAND ) 32 MG tablet Take 1 tablet (32 mg total) by mouth daily. 06/29/20 06/29/21  Trudy Dorn BRAVO, MD  Cyanocobalamin (VITAMIN B-12 PO) Take by mouth daily.    [provider]  diclofenac  Sodium (VOLTAREN ) 1 % GEL SMARTSIG:2 Topical Twice Daily    [provider]  metaxalone (SKELAXIN) 800 MG tablet Take 800 mg by mouth 3 (three) times daily.    [provider]  Multiple Vitamins-Minerals (MULTIVITAMIN WOMEN PO) Take by mouth daily.    [provider]  omeprazole (PRILOSEC) 40 MG capsule Take 40 mg by mouth daily.    [provider]  omeprazole (PRILOSEC) 40 MG capsule Take 1 tablet by mouth daily. 09/16/19   [provider]  spironolactone (ALDACTONE) 50 MG tablet Take 50 mg by mouth daily.    [provider]  triamterene-hydrochlorothiazide (MAXZIDE-25) 37.5-25 MG tablet Take 1 tablet by mouth daily.    [provider]    Family History Family History  Problem Relation Age of Onset   Diabetes Mother     Hypertension Mother    Breast cancer Neg Hx     Social History Social History   Tobacco Use   Smoking status: Never   Smokeless tobacco: Never  Vaping Use   Vaping status: Never Used  Substance Use Topics   Alcohol use: Yes    Alcohol/week: 1.0 standard drink of alcohol    Types: 1 Standard drinks or equivalent per week    Comment: occasionally   Drug use: Not Currently     Allergies   Amlodipine and Trilyte [peg 3350-kcl-na bicarb-nacl]   Review of Systems Review of Systems: :negative unless otherwise stated in HPI.      Physical Exam Triage Vital Signs ED Triage Vitals  Encounter Vitals Group     BP 07/14/24 0948 120/64     Girls Systolic BP Percentile --      Girls Diastolic BP Percentile --      Boys Systolic BP Percentile --      Boys Diastolic BP Percentile --      Pulse Rate 07/14/24 0948 76     Resp 07/14/24 0948 17     Temp 07/14/24 0948 98.5 F (36.9 C)     Temp Source 07/14/24 0948 Oral     SpO2 07/14/24 0948 97 %     Weight --      Height --      Head Circumference --      Peak Flow --      Pain Score 07/14/24 0946 0     Pain Loc --      Pain Education --      Exclude from Growth Chart --    No data found.  Updated Vital Signs BP 120/64 (BP Location: Left Wrist)   Pulse 76   Temp 98.5 F (36.9 C) (Oral)   Resp 17   LMP 12/05/2018 (Exact Date)   SpO2 97%   Visual Acuity Right Eye Distance:   Left Eye Distance:   Bilateral Distance:    Right Eye Near:   Left Eye Near:    Bilateral Near:     Physical Exam GEN: well appearing female in no acute distress  CVS: well perfused  RESP: speaking in full sentences without pause  GU: deferred, patient performed self swab    UC Treatments / Results  Labs (all labs ordered are listed, but only abnormal results are displayed) Labs Reviewed  URINALYSIS, W/ REFLEX TO CULTURE (INFECTION SUSPECTED) - Abnormal; Notable for the following components:      Result Value   APPearance HAZY (*)     Hgb urine dipstick MODERATE (*)    Protein, ur 30 (*)    Leukocytes,Ua LARGE (*)    Bacteria, UA FEW (*)  All other components within normal limits  URINE CULTURE  CERVICOVAGINAL ANCILLARY ONLY    EKG   Radiology No results found.  Procedures Procedures (including critical care time)  Medications Ordered in UC Medications - No data to display  Initial Impression / Assessment and Plan / UC Course  I have reviewed the triage vital signs and the nursing notes.  Pertinent labs & imaging results that were available during my care of the patient were reviewed by me and considered in my medical decision making (see chart for details).      Patient is a 63 y.o.SABRA female  who presents for vaginal itching and dysuria.  Overall patient is well-appearing and afebrile.  Vital signs stable.  UA consistent with acute cystitis.   Hematuria supported on microscopy.  Treat with Macrobid  2 times daily for 5 days. Urine culture obtained.  Follow-up sensitivities and change antibiotics, if needed.  Vaginal swab for yeast vaginitis and bacterial vaginitis, trichomonas, gonorrhea and chlamydia obtained.   - Treatment:Macrobid  twice daily for 5 days.   Return precautions including abdominal pain, fever, chills, nausea, or vomiting given. Discussed MDM, treatment plan and plan for follow-up with patient who agrees with plan.    Final Clinical Impressions(s) / UC Diagnoses   Final diagnoses:  Acute cystitis with hematuria  Vaginal itching     Discharge Instructions      You have a urinary tract infection. I sent your urine for culture to be sure the antibiotic prescribed will treat your infection. Someone may call you to change antibiotics or discuss treatment for BV or yeast infection based off your sample results.   Stop by the pharmacy to pick up your prescriptions.  Follow up with your primary care provider or return to the urgent care, if not improving.        ED Prescriptions      Medication Sig Dispense Auth. Provider   nitrofurantoin , macrocrystal-monohydrate, (MACROBID ) 100 MG capsule Take 1 capsule (100 mg total) by mouth 2 (two) times daily. 10 capsule Haylen Shelnutt, DO      PDMP not reviewed this encounter.   Beckam Abdulaziz, DO 07/14/24 1040

## 2024-07-14 NOTE — Discharge Instructions (Addendum)
 You have a urinary tract infection. I sent your urine for culture to be sure the antibiotic prescribed will treat your infection. Someone may call you to change antibiotics or discuss treatment for BV or yeast infection based off your sample results.   Stop by the pharmacy to pick up your prescriptions.  Follow up with your primary care provider or return to the urgent care, if not improving.

## 2024-07-14 NOTE — ED Triage Notes (Signed)
 Patient states that sx started 1 weeks ago.   Vaginal itching and burning. Patient states that she had some pain on both sides of her pelvic area that has resolved. Felt like cramps.

## 2024-07-15 LAB — CERVICOVAGINAL ANCILLARY ONLY
Bacterial Vaginitis (gardnerella): NEGATIVE
Candida Glabrata: NEGATIVE
Candida Vaginitis: NEGATIVE
Comment: NEGATIVE
Comment: NEGATIVE
Comment: NEGATIVE

## 2024-07-16 ENCOUNTER — Ambulatory Visit (HOSPITAL_COMMUNITY): Payer: Self-pay

## 2024-07-16 LAB — URINE CULTURE: Culture: 30000 — AB

## 2024-08-25 ENCOUNTER — Ambulatory Visit
Admission: RE | Admit: 2024-08-25 | Discharge: 2024-08-25 | Disposition: A | Discharge status: Home / Self Care | Attending: Anesthesiology | Admitting: Anesthesiology

## 2024-08-25 ENCOUNTER — Other Ambulatory Visit: Payer: Self-pay | Admitting: Anesthesiology

## 2024-08-25 ENCOUNTER — Ambulatory Visit
Admission: RE | Admit: 2024-08-25 | Discharge: 2024-08-25 | Disposition: A | Discharge status: Home / Self Care | Source: Ambulatory Visit | Attending: Anesthesiology | Admitting: Anesthesiology

## 2024-08-25 DIAGNOSIS — E6689 Other obesity not elsewhere classified: Secondary | ICD-10-CM

## 2024-08-25 DIAGNOSIS — G894 Chronic pain syndrome: Secondary | ICD-10-CM | POA: Insufficient documentation

## 2024-08-25 DIAGNOSIS — M25552 Pain in left hip: Secondary | ICD-10-CM | POA: Insufficient documentation

## 2024-08-25 DIAGNOSIS — G8929 Other chronic pain: Secondary | ICD-10-CM | POA: Insufficient documentation

## 2024-08-25 DIAGNOSIS — M545 Low back pain, unspecified: Secondary | ICD-10-CM | POA: Diagnosis present

## 2024-08-25 DIAGNOSIS — M25562 Pain in left knee: Secondary | ICD-10-CM | POA: Diagnosis present

## 2024-08-25 DIAGNOSIS — M25561 Pain in right knee: Secondary | ICD-10-CM | POA: Diagnosis present

## 2024-08-25 DIAGNOSIS — M25551 Pain in right hip: Secondary | ICD-10-CM | POA: Insufficient documentation

## 2024-08-28 ENCOUNTER — Encounter: Payer: Self-pay | Admitting: Family Medicine

## 2024-08-28 ENCOUNTER — Other Ambulatory Visit: Payer: Self-pay | Admitting: Family Medicine

## 2024-08-28 ENCOUNTER — Ambulatory Visit
Admission: RE | Admit: 2024-08-28 | Discharge: 2024-08-28 | Disposition: A | Discharge status: Home / Self Care | Source: Ambulatory Visit | Attending: Family Medicine | Admitting: Family Medicine

## 2024-08-28 DIAGNOSIS — M25472 Effusion, left ankle: Secondary | ICD-10-CM | POA: Diagnosis present

## 2024-08-28 DIAGNOSIS — M25471 Effusion, right ankle: Secondary | ICD-10-CM | POA: Diagnosis present

## 2024-09-15 ENCOUNTER — Other Ambulatory Visit: Payer: Self-pay | Admitting: Family Medicine

## 2024-09-15 DIAGNOSIS — Z1231 Encounter for screening mammogram for malignant neoplasm of breast: Secondary | ICD-10-CM

## 2024-09-24 ENCOUNTER — Other Ambulatory Visit: Payer: Self-pay | Admitting: Anesthesiology

## 2024-09-24 DIAGNOSIS — M5416 Radiculopathy, lumbar region: Secondary | ICD-10-CM

## 2024-10-08 ENCOUNTER — Inpatient Hospital Stay: Admission: RE | Admit: 2024-10-08 | Source: Ambulatory Visit

## 2024-10-21 ENCOUNTER — Ambulatory Visit
Admission: RE | Admit: 2024-10-21 | Discharge: 2024-10-21 | Disposition: A | Discharge status: Home / Self Care | Source: Ambulatory Visit | Attending: Family Medicine | Admitting: Family Medicine

## 2024-10-21 DIAGNOSIS — Z1231 Encounter for screening mammogram for malignant neoplasm of breast: Secondary | ICD-10-CM | POA: Insufficient documentation

## 2024-11-11 ENCOUNTER — Ambulatory Visit
Admission: RE | Admit: 2024-11-11 | Discharge: 2024-11-11 | Disposition: A | Source: Ambulatory Visit | Attending: Anesthesiology | Admitting: Anesthesiology

## 2024-11-11 DIAGNOSIS — M5416 Radiculopathy, lumbar region: Secondary | ICD-10-CM

## 2024-11-12 ENCOUNTER — Other Ambulatory Visit: Payer: Self-pay | Admitting: Anesthesiology

## 2024-11-12 DIAGNOSIS — M5416 Radiculopathy, lumbar region: Secondary | ICD-10-CM

## 2025-01-11 ENCOUNTER — Encounter: Payer: Self-pay | Admitting: *Deleted
